# Patient Record
Sex: Female | Born: 1990 | Race: White | Hispanic: Yes | Smoking: Never smoker
Health system: Southern US, Community
[De-identification: ages and names within clinical notes are randomized; demographics above are authoritative.]

## PROBLEM LIST (undated history)

## (undated) ENCOUNTER — Ambulatory Visit

## (undated) DIAGNOSIS — G43909 Migraine, unspecified, not intractable, without status migrainosus: Secondary | ICD-10-CM

## (undated) DIAGNOSIS — N390 Urinary tract infection, site not specified: Secondary | ICD-10-CM

## (undated) DIAGNOSIS — J45909 Unspecified asthma, uncomplicated: Secondary | ICD-10-CM

## (undated) DIAGNOSIS — R7303 Prediabetes: Secondary | ICD-10-CM

## (undated) DIAGNOSIS — M1990 Unspecified osteoarthritis, unspecified site: Secondary | ICD-10-CM

## (undated) HISTORY — DX: Urinary tract infection, site not specified: N39.0

## (undated) HISTORY — DX: Migraine, unspecified, not intractable, without status migrainosus: G43.909

---
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
## 2013-10-30 ENCOUNTER — Emergency Department (INDEPENDENT_AMBULATORY_CARE_PROVIDER_SITE_OTHER): Payer: PRIVATE HEALTH INSURANCE

## 2013-10-30 ENCOUNTER — Emergency Department (INDEPENDENT_AMBULATORY_CARE_PROVIDER_SITE_OTHER)
Admission: EM | Admit: 2013-10-30 | Discharge: 2013-10-30 | Disposition: A | Discharge status: Home / Self Care | Payer: PRIVATE HEALTH INSURANCE | Source: Home / Self Care | Attending: Family Medicine | Admitting: Family Medicine

## 2013-10-30 ENCOUNTER — Encounter (HOSPITAL_COMMUNITY): Payer: Self-pay | Admitting: Emergency Medicine

## 2013-10-30 DIAGNOSIS — J45990 Exercise induced bronchospasm: Secondary | ICD-10-CM

## 2013-10-30 MED ORDER — IPRATROPIUM-ALBUTEROL 0.5-2.5 (3) MG/3ML IN SOLN
RESPIRATORY_TRACT | Status: AC
  Filled 2013-10-30: qty 3

## 2013-10-30 MED ORDER — ALBUTEROL SULFATE (2.5 MG/3ML) 0.083% IN NEBU
5.0000 mg | INHALATION_SOLUTION | Freq: Once | RESPIRATORY_TRACT | Status: AC
  Administered 2013-10-30: 5 mg via RESPIRATORY_TRACT

## 2013-10-30 MED ORDER — ALBUTEROL SULFATE HFA 108 (90 BASE) MCG/ACT IN AERS
1.0000 | INHALATION_SPRAY | Freq: Four times a day (QID) | RESPIRATORY_TRACT | Status: DC | PRN
Start: 2013-10-30 — End: 2016-03-29

## 2013-10-30 MED ORDER — IPRATROPIUM BROMIDE 0.02 % IN SOLN
0.5000 mg | Freq: Once | RESPIRATORY_TRACT | Status: AC
  Administered 2013-10-30: 0.5 mg via RESPIRATORY_TRACT

## 2013-10-30 NOTE — ED Notes (Signed)
C/o coughing which is non productive States throat is itchy which makes her cough States she has chest pain which makes her chest feels tight

## 2013-10-30 NOTE — ED Provider Notes (Signed)
CSN: [id number]     Arrival date & time 10/30/13  1732 History   First MD Initiated Contact with Patient 10/30/13 1822     Chief Complaint  Patient presents with  . Cough  . Chest Pain   (Consider location/radiation/quality/duration/timing/severity/associated sxs/prior Treatment) HPI Comments: Patient reports one week history of non productive cough that is most noticeable after exercise. States her throat feels itchy. The coughing that she experiences makes her chest feel "tight." States she did have episode of pneumonia in April 2015 while she was away at college and she would also like to make sure that this has resolved. Denies chest pain, fever or hemoptysis.  [employer] student home for the summer PCP: none  The history is provided by the patient.    History reviewed. No pertinent past medical history. History reviewed. No pertinent past surgical history. History reviewed. No pertinent family history. History  Substance Use Topics  . Smoking status: Never Smoker   . Smokeless tobacco: Not on file  . Alcohol Use: No   OB History   Grav Para Term Preterm Abortions TAB SAB Ect Mult Living                 Review of Systems  Constitutional: Negative.   HENT: Positive for congestion and postnasal drip. Negative for ear pain, mouth sores, nosebleeds, rhinorrhea, sinus pressure, sore throat, trouble swallowing and voice change.   Eyes: Negative.   Respiratory: Positive for cough, chest tightness and wheezing.   Cardiovascular: Negative.   Gastrointestinal: Negative.   Genitourinary: Negative.   Musculoskeletal: Negative.   Skin: Negative.   Neurological: Negative for dizziness, syncope, weakness, light-headedness and headaches.    Allergies  Review of patient's allergies indicates no known allergies.  Home Medications   Prior to Admission medications   Medication Sig Start Date End Date Taking? Authorizing Jebadiah Imperato  albuterol (PROVENTIL HFA;VENTOLIN HFA) 108  (90 BASE) MCG/ACT inhaler Inhale 1-2 puffs into the lungs every 6 (six) hours as needed for wheezing or shortness of breath (or persistent cough). 10/30/13   Jess BartersJennifer Lee Presson, PA   BP 124/81  Pulse 80  Temp(Src) 99.1 F (37.3 C) (Oral)  Resp 16  SpO2 99%  LMP 10/16/2013 Physical Exam  Nursing note and vitals reviewed. Constitutional: She is oriented to person, place, and time. She appears well-developed and well-nourished. No distress.  HENT:  Head: Normocephalic and atraumatic.  Right Ear: Hearing, tympanic membrane, external ear and ear canal normal.  Left Ear: Hearing, tympanic membrane, external ear and ear canal normal.  Nose: Nose normal.  Mouth/Throat: Uvula is midline, oropharynx is clear and moist and mucous membranes are normal.  Eyes: Conjunctivae are normal. Right eye exhibits no discharge. Left eye exhibits no discharge.  Neck: Normal range of motion. Neck supple.  Cardiovascular: Normal rate, regular rhythm and normal heart sounds.   Pulmonary/Chest: Effort normal and breath sounds normal. No respiratory distress. She has no wheezes. She exhibits no tenderness.  Musculoskeletal: Normal range of motion.  Neurological: She is alert and oriented to person, place, and time.  Skin: Skin is warm and dry. No rash noted. No erythema.  Psychiatric: She has a normal mood and affect. Her behavior is normal.    ED Course  Procedures (including critical care time) Labs Review Labs Reviewed - No data to display  Imaging Review Dg Chest 2 View  10/30/2013   CLINICAL DATA:  One week history of cough. Shortness of breath over the past 4-5 days.  EXAM: CHEST  2 VIEW  COMPARISON:  05/16/2010, 01/04/2010.  FINDINGS: Cardiomediastinal silhouette unremarkable and unchanged. Lungs clear. Bronchovascular markings normal. Pulmonary vascularity normal. No visible pleural effusions. No pneumothorax. Visualized bony thorax intact. No significant interval change.  IMPRESSION: Normal and stable  examination.   Electronically Signed   By: Hulan Saashomas  Lawrence M.D.   On: 10/30/2013 19:22     MDM   1. Bronchospasm, exercise-induced   [facility] and states she feels much better. CXR unremarkable. Will advise her to begin OTC antihistamine such as Zyrtec or Claritin for PND and itchy throat and use albuterol MDI as prescribed for exercised induced bronchospasm. Follow up PCP of her choice.     Jess BartersJennifer Lee GorePresson, [place] 10/30/13 2000

## 2013-10-30 NOTE — ED Provider Notes (Signed)
Medical screening examination/treatment/procedure(s) were performed by resident physician or non-physician practitioner and as supervising physician I was immediately available for consultation/collaboration.   Barkley BrunsKINDL,JAMES DOUGLAS MD.   Linna HoffJames D Kindl, MD 10/30/13 2124

## 2013-10-30 NOTE — Discharge Instructions (Signed)
Your chest xray was normal. You do not have pneumonia. Your EKG was normal as well. It sounds like you are having a bit of exercised induced asthma and cough. I would recommend you use the albuterol inhaler as prescribed for your symptoms and if you have no improvement, please follow up with your primary care doctor. If symptoms become suddenly worse or severe and do not respond to the medication, please seek evaluation at your nearest ER.  Bronchospasm A bronchospasm is a spasm or tightening of the airways going into the lungs. During a bronchospasm breathing becomes more difficult because the airways get smaller. When this happens there can be coughing, a whistling sound when breathing (wheezing), and difficulty breathing. Bronchospasm is often associated with asthma, but not all patients who experience a bronchospasm have asthma. CAUSES  A bronchospasm is caused by inflammation or irritation of the airways. The inflammation or irritation may be triggered by:   Allergies (such as to animals, pollen, food, or mold). Allergens that cause bronchospasm may cause wheezing immediately after exposure or many hours later.   Infection. Viral infections are believed to be the most common cause of bronchospasm.   Exercise.   Irritants (such as pollution, cigarette smoke, strong odors, aerosol sprays, and paint fumes).   Weather changes. Winds increase molds and pollens in the air. Rain refreshes the air by washing irritants out. Cold air may cause inflammation.   Stress and emotional upset.  SIGNS AND SYMPTOMS   Wheezing.   Excessive nighttime coughing.   Frequent or severe coughing with a simple cold.   Chest tightness.   Shortness of breath.  DIAGNOSIS  Bronchospasm is usually diagnosed through a history and physical exam. Tests, such as chest X-rays, are sometimes done to look for other conditions. TREATMENT   Inhaled medicines can be given to open up your airways and help you  breathe. The medicines can be given using either an inhaler or a nebulizer machine.  Corticosteroid medicines may be given for severe bronchospasm, usually when it is associated with asthma. HOME CARE INSTRUCTIONS   Always have a plan prepared for seeking medical care. Know when to call your health care provider and local emergency services (911 in the [place] Know where you can access local emergency care.  Only take medicines as directed by your health care provider.  If you were prescribed an inhaler or nebulizer machine, ask your health care provider to explain how to use it correctly. Always use a spacer with your inhaler if you were given one.  It is necessary to remain calm during an attack. Try to relax and breathe more slowly.  Control your home environment in the following ways:   Change your heating and air conditioning filter at least once a month.   Limit your use of fireplaces and wood stoves.  Do not smoke and do not allow smoking in your home.   Avoid exposure to perfumes and fragrances.   Get rid of pests (such as roaches and mice) and their droppings.   Throw away plants if you see mold on them.   Keep your house clean and dust free.   Replace carpet with wood, tile, or vinyl flooring. Carpet can trap dander and dust.   Use allergy-proof pillows, mattress covers, and box spring covers.   Wash bed sheets and blankets every week in hot water and dry them in a dryer.   Use blankets that are made of polyester or cotton.   Wash hands frequently.  SEEK MEDICAL CARE IF:   You have muscle aches.   You have chest pain.   The sputum changes from clear or white to yellow, green, gray, or bloody.   The sputum you cough up gets thicker.   There are problems that may be related to the medicine you are given, such as a rash, itching, swelling, or trouble breathing.  SEEK IMMEDIATE MEDICAL CARE IF:   You have worsening wheezing and coughing even  after taking your prescribed medicines.   You have increased difficulty breathing.   You develop severe chest pain. MAKE SURE YOU:   Understand these instructions.  Will watch your condition.  Will get help right away if you are not doing well or get worse. Document Released: 04/23/2003 Document Revised: 04/25/2013 Document Reviewed: 10/10/2012 [facility] Patient Information 2015 [place], [place]. This information is not intended to replace advice given to you by your health care provider. Make sure you discuss any questions you have with your health care provider.  Cough, Adult  A cough is a reflex that helps clear your throat and airways. It can help heal the body or may be a reaction to an irritated airway. A cough may only last 2 or 3 weeks (acute) or may last more than 8 weeks (chronic).  CAUSES Acute cough:  Viral or bacterial infections. Chronic cough:  Infections.  Allergies.  Asthma.  Post-nasal drip.  Smoking.  Heartburn or acid reflux.  Some medicines.  Chronic lung problems (COPD).  Cancer. SYMPTOMS   Cough.  Fever.  Chest pain.  Increased breathing rate.  High-pitched whistling sound when breathing (wheezing).  Colored mucus that you cough up (sputum). TREATMENT   A bacterial cough may be treated with antibiotic medicine.  A viral cough must run its course and will not respond to antibiotics.  Your caregiver may recommend other treatments if you have a chronic cough. HOME CARE INSTRUCTIONS   Only take over-the-counter or prescription medicines for pain, discomfort, or fever as directed by your caregiver. Use cough suppressants only as directed by your caregiver.  Use a cold steam vaporizer or humidifier in your bedroom or home to help loosen secretions.  Sleep in a semi-upright position if your cough is worse at night.  Rest as needed.  Stop smoking if you smoke. SEEK IMMEDIATE MEDICAL CARE IF:   You have pus in your sputum.  Your  cough starts to worsen.  You cannot control your cough with suppressants and are losing sleep.  You begin coughing up blood.  You have difficulty breathing.  You develop pain which is getting worse or is uncontrolled with medicine.  You have a fever. MAKE SURE YOU:   Understand these instructions.  Will watch your condition.  Will get help right away if you are not doing well or get worse. Document Released: 10/17/2010 Document Revised: 07/13/2011 Document Reviewed: 10/17/2010 [facility] Patient Information 2015 [place], [place]. This information is not intended to replace advice given to you by your health care provider. Make sure you discuss any questions you have with your health care provider.  Exercise-Induced Bronchospasm A bronchospam is a condition that is commonly caused by exercise, in which the muscles around the bronchioles (airways to the lungs) tighten, causing the airway to constrict. Exercise-induced brochospams are usually associated with short periods of vigorous activity. Many people who experience an exercise-induced bronchospam may not notice at the time of the event; however, the athlete may later experience symptoms that negatively affect training and performance. SYMPTOMS  High-pitched sounds with breathing (wheezing).  Coughing.  Increased work of breathing (dyspnea).  Rapid breathing (hyperventilation).  Chest pain.  Symptoms occurring 4 to 6 hours after exercise is completed (late-phase reaction). CAUSES  It is not know why certain individuals experience bronchospasms. Respiratory specialists currently think that the cool or dry air breathed in may cause damage the lining of the bronchioles, which elicits and inflammatory response. The inflammation causes the airways to narrow and symptoms then occur. RISK INCREASES WITH:  Viral infections.  Exercise in cold air.  Exercise in dry conditions.  Poor physical fitness.  High-intensity  exercise.  No warm-up before play.  Frequent exposure to substances that produce allergic reactions (allergens). PREVENTION   Improve conditioning.  Treat allergies.  Breathe warm air (cover mouth and nose with a towel or scarf).  Warm up for an appropriate period of time before physical activity.  Gradually decrease intensity (warm down ) for an appropriate period of time after physical activity. PROGNOSIS  Most people with exercise-induced asthma respond well to medication. Patients are typically prescribed an inhaler to treat bronchospams. However, if symptoms persist despite treatment and continue to affect performance, individuals may need to consider avoiding activities that produce symptoms. RELATED COMPLICATIONS   Decreased athletic performance.  Inability to condition as well as expected.  Side effects from medications. TREATMENT   Maintain physical fitness.  Run with a scarf or towel over your mouth in cold, dry air.  Complete at least 10 minutes of warm up before high-intensity exercise.  Warm down after play.  Treat allergies. MEDICATION  The usual initial medication is an albuterol inhaler, which expands the constricted bronchioles.  The second-line medication is inhaled corticosteroids, which reduce inflammation in the airway.  Alternative medications included sodium cromoglycate and nedocromil inhalers.  Long-acting medications such as salmeterol can also be used as second-line medications. ACTIVITY  If medications are able to treat the offending symptoms, then no activity modification is required. If you know you will be training or competing in cold or dry climates take extra precaution to prevent symptoms. DIET  No specific diet is recommended.  SEEK MEDICAL CARE IF:   Greater than normal fatigue with exercise.  Greater than normal difficulty breathing occurs with exercise.  Increased wheezing with exercise.  You appear to be breathing harder  and faster than expected with training.  Allergies appear to be uncontrollable.  You experience chest pain with exercise. Document Released: 04/20/2005 Document Revised: 07/13/2011 Document Reviewed: 08/02/2008 [facility] Patient Information 2015 [place]. This information is not intended to replace advice given to you by your health care provider. Make sure you discuss any questions you have with your health care provider.

## 2014-10-15 ENCOUNTER — Emergency Department (HOSPITAL_COMMUNITY)
Admission: EM | Admit: 2014-10-15 | Discharge: 2014-10-15 | Disposition: A | Discharge status: Home / Self Care | Payer: BLUE CROSS/BLUE SHIELD | Source: Home / Self Care | Attending: Family Medicine | Admitting: Family Medicine

## 2014-10-15 ENCOUNTER — Encounter (HOSPITAL_COMMUNITY): Payer: Self-pay | Admitting: Emergency Medicine

## 2014-10-15 DIAGNOSIS — N3000 Acute cystitis without hematuria: Secondary | ICD-10-CM | POA: Diagnosis not present

## 2014-10-15 LAB — POCT URINALYSIS DIP (DEVICE)
BILIRUBIN URINE: NEGATIVE
GLUCOSE, UA: NEGATIVE mg/dL
Hgb urine dipstick: NEGATIVE
Ketones, ur: NEGATIVE mg/dL
NITRITE: NEGATIVE
Protein, ur: NEGATIVE mg/dL
Specific Gravity, Urine: >=1.03 (ref 1.005–1.030)
Urobilinogen, UA: 0.2 mg/dL (ref 0.0–1.0)
pH: 5.5 (ref 5.0–8.0)

## 2014-10-15 LAB — POCT PREGNANCY, URINE: Preg Test, Ur: NEGATIVE

## 2014-10-15 MED ORDER — CEPHALEXIN 500 MG PO CAPS: 500.0000 mg | ORAL_CAPSULE | Freq: Two times a day (BID) | ORAL | Status: DC

## 2014-10-15 NOTE — ED Notes (Signed)
C/o poss UTI sx onset 1 week Sx include urinary freq/urgency Denies fevers, chills, hematuria  Alert, no signs of acute distress.

## 2014-10-15 NOTE — Discharge Instructions (Signed)
Thank you for coming in today. ° °Urinary Tract Infection °Urinary tract infections (UTIs) can develop anywhere along your urinary tract. Your urinary tract is your body's drainage system for removing wastes and extra water. Your urinary tract includes two kidneys, two ureters, a bladder, and a urethra. Your kidneys are a pair of bean-shaped organs. Each kidney is about the size of your fist. They are located below your ribs, one on each side of your spine. °CAUSES °Infections are caused by microbes, which are microscopic organisms, including fungi, viruses, and bacteria. These organisms are so small that they can only be seen through a microscope. Bacteria are the microbes that most commonly cause UTIs. °SYMPTOMS  °Symptoms of UTIs may vary by age and gender of the patient and by the location of the infection. Symptoms in young women typically include a frequent and intense urge to urinate and a painful, burning feeling in the bladder or urethra during urination. Older women and men are more likely to be tired, shaky, and weak and have muscle aches and abdominal pain. A fever may mean the infection is in your kidneys. Other symptoms of a kidney infection include pain in your back or sides below the ribs, nausea, and vomiting. °DIAGNOSIS °To diagnose a UTI, your caregiver will ask you about your symptoms. Your caregiver also will ask to provide a urine sample. The urine sample will be tested for bacteria and white blood cells. White blood cells are made by your body to help fight infection. °TREATMENT  °Typically, UTIs can be treated with medication. Because most UTIs are caused by a bacterial infection, they usually can be treated with the use of antibiotics. The choice of antibiotic and length of treatment depend on your symptoms and the type of bacteria causing your infection. °HOME CARE INSTRUCTIONS °· If you were prescribed antibiotics, take them exactly as your caregiver instructs you. Finish the medication  even if you feel better after you have only taken some of the medication. °· Drink enough water and fluids to keep your urine clear or pale yellow. °· Avoid caffeine, tea, and carbonated beverages. They tend to irritate your bladder. °· Empty your bladder often. Avoid holding urine for long periods of time. °· Empty your bladder before and after sexual intercourse. °· After a bowel movement, women should cleanse from front to back. Use each tissue only once. °SEEK MEDICAL CARE IF:  °· You have back pain. °· You develop a fever. °· Your symptoms do not begin to resolve within 3 days. °SEEK IMMEDIATE MEDICAL CARE IF:  °· You have severe back pain or lower abdominal pain. °· You develop chills. °· You have nausea or vomiting. °· You have continued burning or discomfort with urination. °MAKE SURE YOU:  °· Understand these instructions. °· Will watch your condition. °· Will get help right away if you are not doing well or get worse. °Document Released: 01/28/2005 Document Revised: 10/20/2011 Document Reviewed: 05/29/2011 °[employer]® Patient Information ©2015 [employer]. This information is not intended to replace advice given to you by your health care provider. Make sure you discuss any questions you have with your health care provider. ° °

## 2014-10-15 NOTE — ED Provider Notes (Signed)
Chisato Hatzis is a 24 y.o. female who presents to Urgent Care today for urinary frequency urgency dysuria and burning present for one week. No fevers or chills nausea or vomiting. No vaginal discharge. She has tried ibuprofen which helps some. She feels well otherwise.   History reviewed. No pertinent past medical history. History reviewed. No pertinent past surgical history. History  Substance Use Topics  . Smoking status: Never Smoker   . Smokeless tobacco: Not on file  . Alcohol Use: No   ROS as above Medications: No current facility-administered medications for this encounter.   Current Outpatient Prescriptions  Medication Sig Dispense Refill  . albuterol (PROVENTIL HFA;VENTOLIN HFA) 108 (90 BASE) MCG/ACT inhaler Inhale 1-2 puffs into the lungs every 6 (six) hours as needed for wheezing or shortness of breath (or persistent cough). 1 Inhaler 1  . cephALEXin (KEFLEX) 500 MG capsule Take 1 capsule (500 mg total) by mouth 2 (two) times daily. 14 capsule 0   No Known Allergies   Exam:  BP 134/90 mmHg  Pulse 98  Temp(Src) 98.4 F (36.9 C) (Oral)  Resp 16  SpO2 100%  LMP 10/01/2014 Gen: Well NAD HEENT: EOMI,  MMM Lungs: Normal work of breathing. CTABL Heart: RRR no MRG Abd: NABS, Soft. Nondistended, Nontender no CV angle tenderness to percussion Exts: Brisk capillary refill, warm and well perfused.   Results for orders placed or performed during the hospital encounter of 10/15/14 (from the past 24 hour(s))  POCT urinalysis dip (device)     Status: Abnormal   Collection Time: 10/15/14  6:59 PM  Result Value Ref Range   Glucose, UA NEGATIVE NEGATIVE mg/dL   Bilirubin Urine NEGATIVE NEGATIVE   Ketones, ur NEGATIVE NEGATIVE mg/dL   Specific Gravity, Urine >=1.030 1.005 - 1.030   Hgb urine dipstick NEGATIVE NEGATIVE   pH 5.5 5.0 - 8.0   Protein, ur NEGATIVE NEGATIVE mg/dL   Urobilinogen, UA 0.2 0.0 - 1.0 mg/dL   Nitrite NEGATIVE NEGATIVE   Leukocytes, UA TRACE (A)  NEGATIVE  Pregnancy, urine POC     Status: None   Collection Time: 10/15/14  7:03 PM  Result Value Ref Range   Preg Test, Ur NEGATIVE NEGATIVE   No results found.  Assessment and Plan: 24 y.o. female with urinary tract infection. Treat with Keflex. Return as needed.  Discussed warning signs or symptoms. Please see discharge instructions. Patient expresses understanding.     Rodolph Bong, MD 10/15/14 [phone]

## 2015-11-27 ENCOUNTER — Emergency Department (HOSPITAL_COMMUNITY)
Admission: EM | Admit: 2015-11-27 | Discharge: 2015-11-28 | Disposition: A | Discharge status: Home / Self Care | Payer: BLUE CROSS/BLUE SHIELD | Attending: Emergency Medicine | Admitting: Emergency Medicine

## 2015-11-27 ENCOUNTER — Encounter (HOSPITAL_COMMUNITY): Payer: Self-pay | Admitting: *Deleted

## 2015-11-27 DIAGNOSIS — Z79899 Other long term (current) drug therapy: Secondary | ICD-10-CM | POA: Insufficient documentation

## 2015-11-27 DIAGNOSIS — J45909 Unspecified asthma, uncomplicated: Secondary | ICD-10-CM | POA: Insufficient documentation

## 2015-11-27 DIAGNOSIS — N390 Urinary tract infection, site not specified: Secondary | ICD-10-CM | POA: Insufficient documentation

## 2015-11-27 HISTORY — DX: Unspecified asthma, uncomplicated: J45.909

## 2015-11-27 LAB — POC URINE PREG, ED: PREG TEST UR: NEGATIVE

## 2015-11-27 LAB — COMPREHENSIVE METABOLIC PANEL
ALBUMIN: 4.1 g/dL (ref 3.5–5.0)
ALK PHOS: 69 U/L (ref 38–126)
ALT: 39 U/L (ref 14–54)
AST: 33 U/L (ref 15–41)
Anion gap: 9 (ref 5–15)
BILIRUBIN TOTAL: 0.5 mg/dL (ref 0.3–1.2)
BUN: 9 mg/dL (ref 6–20)
CALCIUM: 9.5 mg/dL (ref 8.9–10.3)
CO2: 24 mmol/L (ref 22–32)
Chloride: 104 mmol/L (ref 101–111)
Creatinine, Ser: 0.68 mg/dL (ref 0.44–1.00)
GFR calc Af Amer: >60 mL/min (ref >60)
GLUCOSE: 82 mg/dL (ref 65–99)
Potassium: 3.7 mmol/L (ref 3.5–5.1)
Sodium: 137 mmol/L (ref 135–145)
TOTAL PROTEIN: 7 g/dL (ref 6.5–8.1)

## 2015-11-27 LAB — URINALYSIS, ROUTINE W REFLEX MICROSCOPIC
BILIRUBIN URINE: NEGATIVE
Glucose, UA: NEGATIVE mg/dL
KETONES UR: NEGATIVE mg/dL
NITRITE: NEGATIVE
PROTEIN: NEGATIVE mg/dL
Specific Gravity, Urine: 1.026 (ref 1.005–1.030)
pH: 5 (ref 5.0–8.0)

## 2015-11-27 LAB — URINE MICROSCOPIC-ADD ON

## 2015-11-27 LAB — CBC
HCT: 39.7 % (ref 36.0–46.0)
Hemoglobin: 13.5 g/dL (ref 12.0–15.0)
MCH: 29.2 pg (ref 26.0–34.0)
MCHC: 34 g/dL (ref 30.0–36.0)
MCV: 85.9 fL (ref 78.0–100.0)
PLATELETS: 337 10*3/uL (ref 150–400)
RBC: 4.62 MIL/uL (ref 3.87–5.11)
RDW: 12.8 % (ref 11.5–15.5)
WBC: 9.7 10*3/uL (ref 4.0–10.5)

## 2015-11-27 LAB — LIPASE, BLOOD: Lipase: 25 U/L (ref 11–51)

## 2015-11-27 MED ORDER — PHENAZOPYRIDINE HCL 200 MG PO TABS: 200.0000 mg | ORAL_TABLET | Freq: Three times a day (TID) | ORAL | 0 refills | Status: DC

## 2015-11-27 MED ORDER — CEPHALEXIN 500 MG PO CAPS: 500.0000 mg | ORAL_CAPSULE | Freq: Two times a day (BID) | ORAL | 0 refills | Status: DC

## 2015-11-27 NOTE — ED Notes (Signed)
Pt refused to get pelvic exam done. Stated she had never had one done and had never been sexually active and did not want it done. She stated if her pain got worse she would come back and get an exam.

## 2015-11-27 NOTE — ED Triage Notes (Signed)
Patient present stating has been burning in her lower abd area, increased urination (strong odor) but has been denying vaginal discharge

## 2015-11-27 NOTE — Discharge Instructions (Signed)
Take your antibiotic as prescribed until you have completed the prescription.  Please follow up with a primary care provider from the Resource Guide provided below in 1 week if your symptoms have not improved. Please return to the Emergency Department if symptoms worsen or new onset of fever, flank pain, abdominal pain, vomiting, difficulty urinating, urinary retention, blood in urine, vaginal bleeding, vaginal discharge.

## 2015-11-27 NOTE — ED Provider Notes (Signed)
[facility]-EMERGENCY DEPT Provider Note   CSN: [id number] Arrival date & time: 11/27/15  2135  First Provider Contact:  First MD Initiated Contact with Patient 11/27/15 2243        History   Chief Complaint Chief Complaint  Patient presents with  . Abdominal Pain    HPI Tara Joseph is a 25 y.o. female.  Pt is a 25 yo female with no PMH who presents to the ED with complaint of abdominal pain, onset 3 days. Pt reports having intermittent "burning" sensation to bilateral lower abdomen which she notes radiates to up the sides of her abdomen. Endorses associated urinary frequency. Pt reports having similar sxs in the past when she was dx with UTI. Denies fever, chills, SOB, CP, N/V/D, hematuria, dysuria, vaginal bleeding, vaginal d/c. LMP 11/09/15. Denies hx of abdominal surgery. Denies taking any medications PTA.    Abdominal Pain   Associated symptoms include frequency.    Past Medical History:  Diagnosis Date  . Asthma     There are no active problems to display for this patient.   History reviewed. No pertinent surgical history.  OB History    No data available       Home Medications    Prior to Admission medications   Medication Sig Start Date End Date Taking? Authorizing Provider  albuterol (PROVENTIL HFA;VENTOLIN HFA) 108 (90 BASE) MCG/ACT inhaler Inhale 1-2 puffs into the lungs every 6 (six) hours as needed for wheezing or shortness of breath (or persistent cough). 10/30/13   Mathis Fare Presson, PA  cephALEXin (KEFLEX) 500 MG capsule Take 1 capsule (500 mg total) by mouth 2 (two) times daily. 10/15/14   Rodolph Bong, MD  cephALEXin (KEFLEX) 500 MG capsule Take 1 capsule (500 mg total) by mouth 2 (two) times daily. 11/27/15   Barrett Henle, PA-C  phenazopyridine (PYRIDIUM) 200 MG tablet Take 1 tablet (200 mg total) by mouth 3 (three) times daily. 11/27/15   Barrett Henle, PA-C    Family History No family history on file.  Social  History Social History  Substance Use Topics  . Smoking status: Never Smoker  . Smokeless tobacco: Never Used  . Alcohol use No     Allergies   Review of patient's allergies indicates no known allergies.   Review of Systems Review of Systems  Gastrointestinal: Positive for abdominal pain.  Genitourinary: Positive for frequency.  All other systems reviewed and are negative.    Physical Exam Updated Vital Signs BP 131/81   Pulse 82   Temp 98.8 F (37.1 C) (Oral)   Resp 18   Ht 5' (1.524 m)   Wt 73.2 kg   LMP 11/09/2015   SpO2 100%   BMI 31.50 kg/m   Physical Exam  Constitutional: She is oriented to person, place, and time. She appears well-developed and well-nourished. No distress.  HENT:  Head: Normocephalic and atraumatic.  Eyes: Conjunctivae and EOM are normal. Right eye exhibits no discharge. Left eye exhibits no discharge. No scleral icterus.  Neck: Normal range of motion. Neck supple.  Cardiovascular: Normal rate, regular rhythm, normal heart sounds and intact distal pulses.   Pulmonary/Chest: Effort normal and breath sounds normal. No respiratory distress. She has no wheezes. She has no rales. She exhibits no tenderness.  Abdominal: Soft. Bowel sounds are normal. She exhibits no distension and no mass. There is tenderness (mild tenderness in lower abdominal quadrants). There is no rebound and no guarding. No hernia.  Musculoskeletal: Normal range of  motion. She exhibits no edema.  Neurological: She is alert and oriented to person, place, and time.  Skin: Skin is warm and dry. She is not diaphoretic.  Nursing note and vitals reviewed.    ED Treatments / Results  Labs (all labs ordered are listed, but only abnormal results are displayed) Labs Reviewed  URINALYSIS, ROUTINE W REFLEX MICROSCOPIC (NOT AT [facility]) - Abnormal; Notable for the following:       Result Value   APPearance CLOUDY (*)    Hgb urine dipstick SMALL (*)    Leukocytes, UA TRACE (*)    All  other components within normal limits  URINE MICROSCOPIC-ADD ON - Abnormal; Notable for the following:    Squamous Epithelial / LPF 6-30 (*)    Bacteria, UA MANY (*)    All other components within normal limits  LIPASE, BLOOD  COMPREHENSIVE METABOLIC PANEL  CBC  POC URINE PREG, ED  GC/CHLAMYDIA PROBE AMP ([facility]) NOT AT [facility]    EKG  EKG Interpretation None       Radiology No results found.  Procedures Procedures (including critical care time)  Medications Ordered in ED Medications - No data to display   Initial Impression / Assessment and Plan / ED Course  I have reviewed the triage vital signs and the nursing notes.  Pertinent labs & imaging results that were available during my care of the patient were reviewed by me and considered in my medical decision making (see chart for details).  Clinical Course    Patient presents with lower abdominal discomfort with associated urinary frequency. Denies fever or flank pain. VSS. Exam showed mild lower abdominal pain, no peritoneal signs, no CVA tenderness. Pregnancy negative. UA consistent with UTI. Remaining labs unremarkable. Plan to do pelvic exam for further evaluation of lower abdominal tenderness. Pt refusing pelvic pt, pt reports she has never been sexually active and does not want a pelvic due to feeling like her sxs are due to her UTI. I discussed with pt my concern for further evaluation of her abdominal pain and recommendation for performing the pelvic exam. Pt reports understanding but continued to refuse pelvic. Plan to d/c pt home with keflex for UTI and outpatient follow up. Discussed strict return precautions with pt.   Final Clinical Impressions(s) / ED Diagnoses   Final diagnoses:  UTI (lower urinary tract infection)    New Prescriptions New Prescriptions   CEPHALEXIN (KEFLEX) 500 MG CAPSULE    Take 1 capsule (500 mg total) by mouth 2 (two) times daily.   PHENAZOPYRIDINE (PYRIDIUM) 200 MG TABLET    Take  1 tablet (200 mg total) by mouth 3 (three) times daily.     Satira Sark [place], [place] 11/27/15 2351    Zadie Rhine, MD 11/28/15 [phone]

## 2016-02-11 ENCOUNTER — Encounter (HOSPITAL_COMMUNITY): Payer: Self-pay | Admitting: Emergency Medicine

## 2016-02-11 ENCOUNTER — Ambulatory Visit (HOSPITAL_COMMUNITY)
Admission: EM | Admit: 2016-02-11 | Discharge: 2016-02-11 | Disposition: A | Discharge status: Home / Self Care | Payer: Self-pay | Attending: Emergency Medicine | Admitting: Emergency Medicine

## 2016-02-11 DIAGNOSIS — H65192 Other acute nonsuppurative otitis media, left ear: Secondary | ICD-10-CM

## 2016-02-11 DIAGNOSIS — H9202 Otalgia, left ear: Secondary | ICD-10-CM

## 2016-02-11 MED ORDER — FLUTICASONE PROPIONATE 50 MCG/ACT NA SUSP: 2.0000 | Freq: Every day | NASAL | 2 refills | Status: DC

## 2016-02-11 MED ORDER — PREDNISONE 20 MG PO TABS: ORAL_TABLET | ORAL | 0 refills | Status: DC

## 2016-02-11 NOTE — Discharge Instructions (Signed)
°  You may try taking 400-600 mg Ibuprofen (Motrin) every 6-8 hours for fever and pain  Alternate with Tylenol  500mg  (1000mg  first dose) acetaminophen (Tylenol) every 4-6 hours as needed for fever and pain

## 2016-02-11 NOTE — ED Triage Notes (Signed)
[facility] a [facility] x 3 weeks.

## 2016-02-11 NOTE — ED Provider Notes (Signed)
CSN: [id number]     Arrival date & time 02/11/16  1741 History   First MD Initiated Contact with Patient 02/11/16 1911     Chief Complaint  Patient presents with  . Otalgia   (Consider location/radiation/quality/duration/timing/severity/associated sxs/prior Treatment) HPI Tara Joseph is a 25 y.o. female presenting to UC with c/o bilateral ear pain, worse in Left ear for about 3 weeks. No associated nasal congestion, cough or sore throat. Pain is aching and sore, 6/10. She has not tried anything at home for pain. Denies fever, chills, n/v/d. Denies trauma to ear.    Past Medical History:  Diagnosis Date  . Asthma    History reviewed. No pertinent surgical history. History reviewed. No pertinent family history. Social History  Substance Use Topics  . Smoking status: Never Smoker  . Smokeless tobacco: Never Used  . Alcohol use No   OB History    No data available     Review of Systems  Constitutional: Negative for chills and fever.  HENT: Positive for ear pain ( bilateral, Left worse than Right). Negative for congestion, sore throat, trouble swallowing and voice change.   Respiratory: Negative for cough and shortness of breath.   Cardiovascular: Negative for chest pain and palpitations.  Gastrointestinal: Negative for abdominal pain, diarrhea, nausea and vomiting.  Musculoskeletal: Negative for arthralgias, back pain and myalgias.  Skin: Negative for rash.  Neurological: Negative for dizziness, light-headedness and headaches.    Allergies  Review of patient's allergies indicates no known allergies.  Home Medications   Prior to Admission medications   Medication Sig Start Date End Date Taking? Authorizing Provider  albuterol (PROVENTIL HFA;VENTOLIN HFA) 108 (90 BASE) MCG/ACT inhaler Inhale 1-2 puffs into the lungs every 6 (six) hours as needed for wheezing or shortness of breath (or persistent cough). 10/30/13   Mathis FareJennifer Lee H Presson, PA  cephALEXin (KEFLEX) 500 MG  capsule Take 1 capsule (500 mg total) by mouth 2 (two) times daily. 10/15/14   Rodolph BongEvan S Corey, MD  cephALEXin (KEFLEX) 500 MG capsule Take 1 capsule (500 mg total) by mouth 2 (two) times daily. 11/27/15   Barrett HenleNicole Elizabeth Nadeau, PA-C  fluticasone [facility](FLONASE) 50 MCG/ACT nasal spray Place 2 sprays into both nostrils daily. 02/11/16   Junius FinnerErin O'Malley, PA-C  phenazopyridine (PYRIDIUM) 200 MG tablet Take 1 tablet (200 mg total) by mouth 3 (three) times daily. 11/27/15   Barrett HenleNicole Elizabeth Nadeau, PA-C  predniSONE (DELTASONE) 20 MG tablet 3 tabs po day one, then 2 po daily x 4 days 02/11/16   Junius FinnerErin O'Malley, PA-C   Meds Ordered and Administered this Visit  Medications - No data to display  BP 129/68 (BP Location: Left Arm)   Pulse 86   Temp 98.4 F (36.9 C) (Oral)   Resp 18   LMP 01/12/2016 (Approximate)   SpO2 100%  No data found.   Physical Exam  Constitutional: She is oriented to person, place, and time. She appears well-developed and well-nourished. No distress.  HENT:  Head: Normocephalic and atraumatic.  Right Ear: Tympanic membrane normal.  Left Ear: Tympanic membrane is not erythematous and not bulging. A middle ear effusion is present.  Nose: Nose normal.  Mouth/Throat: Uvula is midline, oropharynx is clear and moist and mucous membranes are normal.  Eyes: EOM are normal.  Neck: Normal range of motion. Neck supple.  Cardiovascular: Normal rate, regular rhythm and normal heart sounds.   Pulmonary/Chest: Effort normal and breath sounds normal. No stridor. No respiratory distress. She has no wheezes. She  has no rales.  Musculoskeletal: Normal range of motion.  Lymphadenopathy:    She has no cervical adenopathy.  Neurological: She is alert and oriented to person, place, and time.  Skin: Skin is warm and dry. She is not diaphoretic.  Psychiatric: She has a normal mood and affect. Her behavior is normal.  Nursing note and vitals reviewed.   Urgent Care Course   Clinical Course     Procedures (including critical care time)  Labs Review Labs Reviewed - No data to display  Imaging Review No results found.    MDM   1. Acute otalgia, left   2. Acute effusion of left ear    Pt c/o bilateral ear pain for 3 weeks.  Middle ear effusion noted in Left ear w/o erythema or bulging. Pt is afebrile. No congestion noted on exam.  Rx: Prednisone and flonase. Encouraged sinus rinses. May have acetaminophen and ibuprofen as needed for pain. F/u in 1 week if not improving. Sooner if worsening.    Junius Finner, PA-C 02/11/16 [phone]

## 2016-03-29 ENCOUNTER — Ambulatory Visit (HOSPITAL_COMMUNITY)
Admission: EM | Admit: 2016-03-29 | Discharge: 2016-03-29 | Disposition: A | Discharge status: Home / Self Care | Payer: Self-pay | Attending: Internal Medicine | Admitting: Internal Medicine

## 2016-03-29 ENCOUNTER — Encounter (HOSPITAL_COMMUNITY): Payer: Self-pay | Admitting: Emergency Medicine

## 2016-03-29 DIAGNOSIS — L748 Other eccrine sweat disorders: Secondary | ICD-10-CM

## 2016-03-29 MED ORDER — SULFAMETHOXAZOLE-TRIMETHOPRIM 800-160 MG PO TABS: 1.0000 | ORAL_TABLET | Freq: Two times a day (BID) | ORAL | 0 refills | Status: AC

## 2016-03-29 NOTE — ED Provider Notes (Signed)
[facility]    CSN: [id number] Arrival date & time: 03/29/16  1252     History   Chief Complaint Chief Complaint  Patient presents with  . Abscess    HPI Tara Joseph is a 25 y.o. female.  She presents today with a 1wk hx of slightly painful/swollen area at posterior right labia.  This started as a "pimple," which she squeezed.  Subsequently, size/discomfort increased.  No unusual vaginal discharge, equivocal spotting one day this week. No abdominal or pelvic discomfort. No change in bowel habits. Initially had some dysuria, right after popping the bump, but none since. No urinary frequency. No nausea/vomiting. No malaise. No fever. One prior episode of similar symptoms, but bump was smaller and resolved spontaneously.  HPI  Past Medical History:  Diagnosis Date  . Asthma     History reviewed. No pertinent surgical history.     Home Medications    Prior to Admission medications   Medication Sig Start Date End Date Taking? Authorizing Provider  fluticasone (FLONASE) 50 MCG/ACT nasal spray Place 2 sprays into both nostrils daily. 02/11/16   Junius FinnerErin O'Malley, PA-C  sulfamethoxazole-trimethoprim (BACTRIM DS,SEPTRA DS) 800-160 MG tablet Take 1 tablet by mouth 2 (two) times daily. 03/29/16 04/05/16  Eustace MooreLaura W Britainy Kozub, MD    Family History No family history on file.  Social History Social History  Substance Use Topics  . Smoking status: Never Smoker  . Smokeless tobacco: Never Used  . Alcohol use No     Allergies   Patient has no known allergies.   Review of Systems Review of Systems  All other systems reviewed and are negative.    Physical Exam Triage Vital Signs ED Triage Vitals  Enc Vitals Group     BP 03/29/16 1410 116/82     Pulse Rate 03/29/16 1410 79     Resp 03/29/16 1410 16     Temp 03/29/16 1410 98.4 F (36.9 C)     Temp Source 03/29/16 1410 Oral     SpO2 03/29/16 1410 100 %     Weight 03/29/16 1422 150 lb (68 kg)     Height 03/29/16  1422 5' (1.524 m)     Pain Score 03/29/16 1423 4   Updated Vital Signs BP 116/82 (BP Location: Left Arm)   Pulse 79   Temp 98.4 F (36.9 C) (Oral)   Resp 16   Ht 5' (1.524 m)   Wt 150 lb (68 kg)   LMP 03/08/2016   SpO2 100%   BMI 29.29 kg/m  Physical Exam  Constitutional: She is oriented to person, place, and time. No distress.  Alert, nicely groomed  HENT:  Head: Atraumatic.  Eyes:  Conjugate gaze, no eye redness/drainage  Neck: Neck supple.  Cardiovascular: Normal rate.   Pulmonary/Chest: No respiratory distress.  Abdominal: She exhibits no distension.  Genitourinary:  Genitourinary Comments: Posterior right labia minora with 1 cm slightly cystic feeling lesion, minimally tender, not fluctuant, no erythema, no blisters or erosion.  Musculoskeletal: Normal range of motion.  No leg swelling  Neurological: She is alert and oriented to person, place, and time.  Skin: Skin is warm and dry.  No cyanosis  Nursing note and vitals reviewed.    UC Treatments / Results   Procedures Procedures (including critical care time) None today  Final Clinical Impressions(s) / UC Diagnoses   Final diagnoses:  Apocrine gland cyst   Symptoms today seem most consistent with a small, slightly inflamed (irritated) or blocked sweat gland.  A Bartholin's cyst is a similar type of thing and I have attached some information about these.  A prescription for trimethoprim/sulfa (antibiotic) was sent to the [facility] on [address], to ease inflammation and treat any infection.  Ice for 5-10 minutes 2-3 times daily may decrease swelling/pain.  Recheck if increasing pain/swelling/redness/drainage occur, or new fever >100.5.   New Prescriptions Discharge Medication List as of 03/29/2016  3:32 PM    START taking these medications   Details  sulfamethoxazole-trimethoprim (BACTRIM DS,SEPTRA DS) 800-160 MG tablet Take 1 tablet by mouth 2 (two) times daily., Starting Sun 03/29/2016, Until Sun 04/05/2016,  Normal         Eustace MooreLaura W Dhani Dannemiller, MD 04/05/16 2219

## 2016-03-29 NOTE — Discharge Instructions (Addendum)
Symptoms today seem most consistent with a small, slightly inflamed (irritated) or blocked sweat gland.  A Bartholin's cyst is a similar type of thing and I have attached some information about these.  A prescription for trimethoprim/sulfa (antibiotic) was sent to the [facility] on [address], to ease inflammation and treat any infection.  Ice for 5-10 minutes 2-3 times daily may decrease swelling/pain.  Recheck if increasing pain/swelling/redness/drainage occur, or new fever >100.5.

## 2016-03-29 NOTE — ED Triage Notes (Signed)
PT reports she believes she has an ingrown hair on her right groin. Has been present for 1 week. PT denies drainage or fever

## 2016-05-28 ENCOUNTER — Emergency Department (HOSPITAL_COMMUNITY): Payer: Self-pay

## 2016-05-28 ENCOUNTER — Encounter (HOSPITAL_COMMUNITY): Payer: Self-pay | Admitting: Emergency Medicine

## 2016-05-28 DIAGNOSIS — J111 Influenza due to unidentified influenza virus with other respiratory manifestations: Secondary | ICD-10-CM | POA: Insufficient documentation

## 2016-05-28 DIAGNOSIS — J45909 Unspecified asthma, uncomplicated: Secondary | ICD-10-CM | POA: Insufficient documentation

## 2016-05-28 LAB — CBC WITH DIFFERENTIAL/PLATELET
BASOS ABS: 0.1 10*3/uL (ref 0.0–0.1)
BASOS PCT: 1 %
EOS ABS: 0.1 10*3/uL (ref 0.0–0.7)
Eosinophils Relative: 2 %
HCT: 37.9 % (ref 36.0–46.0)
Hemoglobin: 12.6 g/dL (ref 12.0–15.0)
LYMPHS ABS: 1.6 10*3/uL (ref 0.7–4.0)
Lymphocytes Relative: 26 %
MCH: 28.3 pg (ref 26.0–34.0)
MCHC: 33.2 g/dL (ref 30.0–36.0)
MCV: 85.2 fL (ref 78.0–100.0)
Monocytes Absolute: 1 10*3/uL (ref 0.1–1.0)
Monocytes Relative: 17 %
Neutro Abs: 3.3 10*3/uL (ref 1.7–7.7)
Neutrophils Relative %: 54 %
PLATELETS: 280 10*3/uL (ref 150–400)
RBC: 4.45 MIL/uL (ref 3.87–5.11)
RDW: 13.1 % (ref 11.5–15.5)
WBC: 6.1 10*3/uL (ref 4.0–10.5)

## 2016-05-28 LAB — URINALYSIS, ROUTINE W REFLEX MICROSCOPIC
BILIRUBIN URINE: NEGATIVE
Glucose, UA: NEGATIVE mg/dL
HGB URINE DIPSTICK: NEGATIVE
Ketones, ur: 5 mg/dL — AB
NITRITE: NEGATIVE
PROTEIN: 30 mg/dL — AB
Specific Gravity, Urine: >1.046 — ABNORMAL HIGH (ref 1.005–1.030)
pH: 5 (ref 5.0–8.0)

## 2016-05-28 LAB — COMPREHENSIVE METABOLIC PANEL
ALBUMIN: 3.8 g/dL (ref 3.5–5.0)
ALK PHOS: 65 U/L (ref 38–126)
ALT: 30 U/L (ref 14–54)
AST: 27 U/L (ref 15–41)
Anion gap: 8 (ref 5–15)
BUN: 9 mg/dL (ref 6–20)
CALCIUM: 9 mg/dL (ref 8.9–10.3)
CHLORIDE: 107 mmol/L (ref 101–111)
CO2: 23 mmol/L (ref 22–32)
CREATININE: 0.71 mg/dL (ref 0.44–1.00)
GFR calc non Af Amer: >60 mL/min (ref >60)
GLUCOSE: 121 mg/dL — AB (ref 65–99)
Potassium: 3.1 mmol/L — ABNORMAL LOW (ref 3.5–5.1)
SODIUM: 138 mmol/L (ref 135–145)
Total Bilirubin: 0.4 mg/dL (ref 0.3–1.2)
Total Protein: 6.6 g/dL (ref 6.5–8.1)

## 2016-05-28 LAB — PREGNANCY, URINE: PREG TEST UR: NEGATIVE

## 2016-05-28 NOTE — ED Triage Notes (Signed)
Pt c/o productive cough, elevated temp, body aches and diarrhea x's 3 days.  Nausea, no vomiting

## 2016-05-29 ENCOUNTER — Emergency Department (HOSPITAL_COMMUNITY)
Admission: EM | Admit: 2016-05-29 | Discharge: 2016-05-29 | Disposition: A | Discharge status: Home / Self Care | Payer: Self-pay | Attending: Emergency Medicine | Admitting: Emergency Medicine

## 2016-05-29 DIAGNOSIS — R69 Illness, unspecified: Secondary | ICD-10-CM

## 2016-05-29 DIAGNOSIS — J111 Influenza due to unidentified influenza virus with other respiratory manifestations: Secondary | ICD-10-CM

## 2016-05-29 MED ORDER — POTASSIUM CHLORIDE CRYS ER 20 MEQ PO TBCR
40.0000 meq | EXTENDED_RELEASE_TABLET | Freq: Once | ORAL | Status: AC
  Administered 2016-05-29: 40 meq via ORAL
  Filled 2016-05-29: qty 2

## 2016-05-29 NOTE — ED Provider Notes (Signed)
[facility]   CSN: [id number] Arrival date & time: 05/28/16  2107   By signing my name below, I, Clovis Pu, attest that this documentation has been prepared under the direction and in the presence of Pricilla Loveless, MD  Electronically Signed: Clovis Pu, ED Scribe. 05/29/16. 3:49 AM.   History   Chief Complaint Chief Complaint  Patient presents with  . Fever   The history is provided by the patient. No language interpreter was used.   HPI Comments:  Tara Joseph is a 26 y.o. female, with a hx of pneumonia, who presents to the Emergency Department complaining of fever x 2 days. She also reports body aches, fatigue, 1 episode of diarrhea (loose stool), productive cough with yellow sputum, sore throat, nausea and headaches. She has been taking Nyquil with little relief. Pt denies ear pain, SOB, hematochezia, vomiting, abdominal pain, dysuria, frequency, urgency and any other associated symptoms at this time. Pt reports she used to have asthma when she was younger.   Past Medical History:  Diagnosis Date  . Asthma     There are no active problems to display for this patient.   History reviewed. No pertinent surgical history.  OB History    No data available       Home Medications    Prior to Admission medications   Medication Sig Start Date End Date Taking? Authorizing Provider  fluticasone (FLONASE) 50 MCG/ACT nasal spray Place 2 sprays into both nostrils daily. 02/11/16   Junius Finner, PA-C    Family History No family history on file.  Social History Social History  Substance Use Topics  . Smoking status: Never Smoker  . Smokeless tobacco: Never Used  . Alcohol use No     Allergies   Patient has no known allergies.   Review of Systems Review of Systems  Constitutional: Positive for fatigue and fever.  HENT: Positive for sore throat. Negative for ear pain.   Respiratory: Positive for cough. Negative for shortness of breath.     Gastrointestinal: Positive for diarrhea and nausea. Negative for abdominal pain and vomiting.  Genitourinary: Negative for dysuria, frequency and urgency.  Musculoskeletal: Positive for myalgias.  Neurological: Positive for headaches.  All other systems reviewed and are negative.  Physical Exam Updated Vital Signs BP 130/87   Pulse 98   Temp 98.8 F (37.1 C) (Oral)   Resp 20   Ht 5' (1.524 m)   Wt 170 lb 1 oz (77.1 kg)   LMP 05/12/2016 (Approximate)   SpO2 97%   BMI 33.21 kg/m   Physical Exam  Constitutional: She is oriented to person, place, and time. She appears well-developed and well-nourished.  HENT:  Head: Normocephalic and atraumatic.  Right Ear: External ear normal.  Left Ear: External ear normal.  Nose: Nose normal.  Mouth/Throat: Oropharynx is clear and moist.  Eyes: Right eye exhibits no discharge. Left eye exhibits no discharge.  Cardiovascular: Regular rhythm and normal heart sounds.  Tachycardia present.   Heart rate in low 100s  Pulmonary/Chest: Effort normal and breath sounds normal.  Abdominal: Soft. There is no tenderness.  Neurological: She is alert and oriented to person, place, and time.  Skin: Skin is warm and dry.  Nursing note and vitals reviewed.    ED Treatments / Results  DIAGNOSTIC STUDIES:  Oxygen Saturation is 100% on RA, normal by my interpretation.    COORDINATION OF CARE:  3:47 AM Discussed treatment plan with pt at bedside and pt agreed to  plan.  Labs (all labs ordered are listed, but only abnormal results are displayed) Labs Reviewed  COMPREHENSIVE METABOLIC PANEL - Abnormal; Notable for the following:       Result Value   Potassium 3.1 (*)    Glucose, Bld 121 (*)    All other components within normal limits  URINALYSIS, ROUTINE W REFLEX MICROSCOPIC - Abnormal; Notable for the following:    Color, Urine AMBER (*)    APPearance CLOUDY (*)    Specific Gravity, Urine >1.046 (*)    Ketones, ur 5 (*)    Protein, ur 30 (*)     Leukocytes, UA MODERATE (*)    Bacteria, UA MANY (*)    Squamous Epithelial / LPF 6-30 (*)    All other components within normal limits  CBC WITH DIFFERENTIAL/PLATELET  PREGNANCY, URINE    EKG  EKG Interpretation None       Radiology Dg Chest 2 View  Result Date: 05/28/2016 CLINICAL DATA:  Productive cough with fever and body aches. EXAM: CHEST  2 VIEW COMPARISON:  10/30/2013 FINDINGS: The heart size and mediastinal contours are within normal limits. Both lungs are clear. The visualized skeletal structures are unremarkable. IMPRESSION: No active cardiopulmonary disease. Electronically Signed   By: Kennith CenterEric  Mansell M.D.   On: 05/28/2016 21:58    Procedures Procedures (including critical care time)  Medications Ordered in ED Medications  potassium chloride SA (K-DUR,KLOR-CON) CR tablet 40 mEq (40 mEq Oral Given 05/29/16 0415)     Initial Impression / Assessment and Plan / ED Course  I have reviewed the triage vital signs and the nursing notes.  Pertinent labs & imaging results that were available during my care of the patient were reviewed by me and considered in my medical decision making (see chart for details).     Patient appears to have a flu-like illness. Her chart lists asthma as a prior history but she has not had issues with asthma for several years. Low risk for flu complications. No signs of PNA. At this time, appears well, appears adequately hydrated. Stable for d/c home with supportive care. Strict return precautions.  Final Clinical Impressions(s) / ED Diagnoses   Final diagnoses:  Influenza-like illness    New Prescriptions Discharge Medication List as of 05/29/2016  3:49 AM     I personally performed the services described in this documentation, which was scribed in my presence. The recorded information has been reviewed and is accurate.    Pricilla LovelessScott Correll Denbow, MD 05/29/16 0730

## 2017-01-18 ENCOUNTER — Encounter (HOSPITAL_COMMUNITY): Payer: Self-pay | Admitting: *Deleted

## 2017-01-18 ENCOUNTER — Ambulatory Visit (HOSPITAL_COMMUNITY)
Admission: EM | Admit: 2017-01-18 | Discharge: 2017-01-18 | Disposition: A | Discharge status: Home / Self Care | Payer: Self-pay | Attending: Urgent Care | Admitting: Urgent Care

## 2017-01-18 DIAGNOSIS — R509 Fever, unspecified: Secondary | ICD-10-CM | POA: Insufficient documentation

## 2017-01-18 DIAGNOSIS — R1310 Dysphagia, unspecified: Secondary | ICD-10-CM | POA: Insufficient documentation

## 2017-01-18 DIAGNOSIS — H9203 Otalgia, bilateral: Secondary | ICD-10-CM | POA: Insufficient documentation

## 2017-01-18 DIAGNOSIS — J45909 Unspecified asthma, uncomplicated: Secondary | ICD-10-CM | POA: Insufficient documentation

## 2017-01-18 DIAGNOSIS — R05 Cough: Secondary | ICD-10-CM

## 2017-01-18 DIAGNOSIS — R52 Pain, unspecified: Secondary | ICD-10-CM

## 2017-01-18 DIAGNOSIS — M791 Myalgia: Secondary | ICD-10-CM

## 2017-01-18 DIAGNOSIS — R059 Cough, unspecified: Secondary | ICD-10-CM

## 2017-01-18 DIAGNOSIS — J029 Acute pharyngitis, unspecified: Secondary | ICD-10-CM | POA: Insufficient documentation

## 2017-01-18 LAB — POCT RAPID STREP A: Streptococcus, Group A Screen (Direct): NEGATIVE

## 2017-01-18 MED ORDER — BENZONATATE 100 MG PO CAPS: 100.0000 mg | ORAL_CAPSULE | Freq: Three times a day (TID) | ORAL | 0 refills | Status: DC | PRN

## 2017-01-18 MED ORDER — AMOXICILLIN 875 MG PO TABS: 875.0000 mg | ORAL_TABLET | Freq: Two times a day (BID) | ORAL | 0 refills | Status: DC

## 2017-01-18 MED ORDER — HYDROCODONE-HOMATROPINE 5-1.5 MG/5ML PO SYRP: 5.0000 mL | ORAL_SOLUTION | Freq: Every evening | ORAL | 0 refills | Status: DC | PRN

## 2017-01-18 NOTE — ED Triage Notes (Signed)
Patient reports fever x several days, sore throat x 1 week, and body aches that started today. Patient has taken ibuprofen, tylenol, sore throat spray, and nyquil to help with symptoms.

## 2017-01-18 NOTE — ED Provider Notes (Signed)
  MRN: [id number] DOB: 01-25-1991  Subjective:   Tara Joseph is a 26 y.o. female presenting for chief complaint of Fever; Generalized Body Aches; Nasal Congestion; and Sore Throat  Reports 2 week history of subjective fever, sore throat, difficulty swallowing, bilateral ear pain (L>R), nasal congestion, productive cough with associated mild shob. Started having body aches today. Has tried APAP, ibuprofen, NyQuil, otc cold and sinus medication. Denies sinus pain, ear drainage, chest pain, wheezing, n/v, abdominal pain, rashes. Denies history of allergies. Denies smoking cigarettes.  Tara Joseph is not currently taking any medications also has No Known Allergies.  Tara Joseph  has a past medical history of Asthma. Denies past surgical history.   Objective:   Vitals: BP 128/83 (BP Location: Left Arm)   Pulse (!) 122   Temp 99.6 F (37.6 C) (Oral)   Resp 18   SpO2 99%   Physical Exam  Constitutional: She is oriented to person, place, and time. She appears well-developed and well-nourished.  HENT:  TM's intact bilaterally, no effusions or erythema. Nasal turbinates pink and moist, nasal passages patent. No sinus tenderness. Oropharynx clear, mucous membranes moist.  Eyes: Right eye exhibits no discharge. Left eye exhibits no discharge.  Neck: Normal range of motion. Neck supple.  Cardiovascular: Normal rate, regular rhythm and intact distal pulses.  Exam reveals no gallop and no friction rub.   No murmur heard. Pulmonary/Chest: No respiratory distress. She has no wheezes. She has no rales.  Lymphadenopathy:    She has cervical adenopathy (bilateral, anterior).  Neurological: She is alert and oriented to person, place, and time.  Skin: Skin is warm and dry.  Psychiatric: She has a normal mood and affect.   Results for orders placed or performed during the hospital encounter of 01/18/17 (from the past 24 hour(s))  POCT rapid strep A [facility])     Status: None   Collection Time: 01/18/17   8:02 PM  Result Value Ref Range   Streptococcus, Group A Screen (Direct) NEGATIVE NEGATIVE   Assessment and Plan :   Pharyngitis, unspecified etiology  Body aches  Cough  Will cover for infectious process given 2 week history of worsening symptoms despite supportive care. Patient is in agreement. Return-to-clinic precautions discussed, patient verbalized understanding.    Wallis Bamberg, PA-C [facility]  01/18/2017  8:07 PM    Wallis Bamberg, PA-C 01/18/17 2030

## 2017-01-21 LAB — CULTURE, GROUP A STREP (THRC)

## 2017-02-02 ENCOUNTER — Ambulatory Visit (HOSPITAL_COMMUNITY)
Admission: EM | Admit: 2017-02-02 | Discharge: 2017-02-02 | Disposition: A | Discharge status: Home / Self Care | Payer: Self-pay | Attending: Family Medicine | Admitting: Family Medicine

## 2017-02-02 ENCOUNTER — Encounter (HOSPITAL_COMMUNITY): Payer: Self-pay | Admitting: *Deleted

## 2017-02-02 DIAGNOSIS — J029 Acute pharyngitis, unspecified: Secondary | ICD-10-CM

## 2017-02-02 LAB — POCT INFECTIOUS MONO SCREEN: MONO SCREEN: NEGATIVE

## 2017-02-02 MED ORDER — PREDNISONE 10 MG (21) PO TBPK: ORAL_TABLET | Freq: Every day | ORAL | 0 refills | Status: DC

## 2017-02-02 MED ORDER — HYDROCODONE-ACETAMINOPHEN 5-325 MG PO TABS: 1.0000 | ORAL_TABLET | Freq: Four times a day (QID) | ORAL | 0 refills | Status: DC | PRN

## 2017-02-02 NOTE — ED Triage Notes (Signed)
Patient was here approx 2 weeks ago for flu like symptoms was prescribed antibiotics, states she is still having sore throat and feels like her lymph nodes are swollen. Reports pain with swallowing.

## 2017-02-03 NOTE — ED Provider Notes (Signed)
  [facility]   [id number] 02/02/17 Arrival Time: 1622  ASSESSMENT & PLAN:  1. Sore throat    Monospot: Negative.  Meds ordered this encounter  Medications  . predniSONE (STERAPRED UNI-PAK 21 TAB) 10 MG (21) TBPK tablet    Sig: Take by mouth daily. Take as directed.    Dispense:  21 tablet    Refill:  0  . HYDROcodone-acetaminophen (NORCO/VICODIN) 5-325 MG tablet    Sig: Take 1 tablet by mouth every 6 (six) hours as needed for moderate pain or severe pain.    Dispense:  6 tablet    Refill:  0   Trial of medications above. Medication sedation precautions given. Will f/u 48-72 hours if not improving, sooner if needed. No signs of peritonsillar abscess noted.  Reviewed expectations re: course of current medical issues. Questions answered. Outlined signs and symptoms indicating need for more acute intervention. Patient verbalized understanding. After Visit Summary given.   SUBJECTIVE:  Tara Joseph is a 26 y.o. female who reports a sore throat. Onset gradual beginning several weeks ago. Seen approx 2 weeks ago and treated with antibiotics. Finished course. No significant help. Feels nodes of neck swollen. Tolerating PO intake but painful swallowing. Ibuprofen with mild, temporary help. No n/v. No current fevers. No rashes. Overall fatigue. No significant respiratory symptoms..  ROS: As per HPI.   OBJECTIVE:  Vitals:   02/02/17 1647  BP: 132/83  Pulse: 86  Resp: 17  Temp: 98.4 F (36.9 C)  TempSrc: Oral  SpO2: 100%     General appearance: alert; no distress HEENT: mild erythema Neck: supple with FROM; cervical LAD; small and tender Lungs: clear to auscultation bilaterally Skin: reveals no rash; warm and dry Psychological: alert and cooperative; normal mood and affect  Results for orders placed or performed during the hospital encounter of 02/02/17  Infectious mono screen, POC  Result Value Ref Range   Mono Screen NEGATIVE NEGATIVE    Labs Reviewed    POCT INFECTIOUS MONO SCREEN    No Known Allergies  Past Medical History:  Diagnosis Date  . Asthma    Social History   Social History  . Marital status: Single    Spouse name: N/A  . Number of children: N/A  . Years of education: N/A   Occupational History  . Not on file.   Social History Main Topics  . Smoking status: Never Smoker  . Smokeless tobacco: Never Used  . Alcohol use No  . Drug use: No  . Sexual activity: Not on file   Other Topics Concern  . Not on file   Social History Narrative  . No narrative on file           Mardella Layman, MD 02/03/17 [phone]

## 2017-08-23 DIAGNOSIS — M545 Low back pain: Secondary | ICD-10-CM | POA: Diagnosis not present

## 2017-08-23 DIAGNOSIS — M9903 Segmental and somatic dysfunction of lumbar region: Secondary | ICD-10-CM | POA: Diagnosis not present

## 2017-08-23 DIAGNOSIS — M9901 Segmental and somatic dysfunction of cervical region: Secondary | ICD-10-CM | POA: Diagnosis not present

## 2017-12-13 ENCOUNTER — Ambulatory Visit (HOSPITAL_COMMUNITY)
Admission: EM | Admit: 2017-12-13 | Discharge: 2017-12-13 | Disposition: A | Discharge status: Home / Self Care | Payer: 59 | Attending: Physician Assistant | Admitting: Physician Assistant

## 2017-12-13 ENCOUNTER — Ambulatory Visit (INDEPENDENT_AMBULATORY_CARE_PROVIDER_SITE_OTHER): Payer: 59

## 2017-12-13 ENCOUNTER — Encounter (HOSPITAL_COMMUNITY): Payer: Self-pay

## 2017-12-13 DIAGNOSIS — S0081XA Abrasion of other part of head, initial encounter: Secondary | ICD-10-CM | POA: Diagnosis not present

## 2017-12-13 DIAGNOSIS — S51851A Open bite of right forearm, initial encounter: Secondary | ICD-10-CM

## 2017-12-13 DIAGNOSIS — Z23 Encounter for immunization: Secondary | ICD-10-CM | POA: Diagnosis not present

## 2017-12-13 DIAGNOSIS — S59911A Unspecified injury of right forearm, initial encounter: Secondary | ICD-10-CM | POA: Diagnosis not present

## 2017-12-13 DIAGNOSIS — W503XXA Accidental bite by another person, initial encounter: Secondary | ICD-10-CM | POA: Diagnosis not present

## 2017-12-13 DIAGNOSIS — M7989 Other specified soft tissue disorders: Secondary | ICD-10-CM | POA: Diagnosis not present

## 2017-12-13 MED ORDER — TETANUS-DIPHTH-ACELL PERTUSSIS 5-2.5-18.5 LF-MCG/0.5 IM SUSP
INTRAMUSCULAR | Status: AC
  Filled 2017-12-13: qty 0.5

## 2017-12-13 MED ORDER — AMOXICILLIN-POT CLAVULANATE 875-125 MG PO TABS: 1.0000 | ORAL_TABLET | Freq: Two times a day (BID) | ORAL | 0 refills | Status: DC

## 2017-12-13 MED ORDER — TETANUS-DIPHTH-ACELL PERTUSSIS 5-2.5-18.5 LF-MCG/0.5 IM SUSP
0.5000 mL | Freq: Once | INTRAMUSCULAR | Status: AC
  Administered 2017-12-13: 0.5 mL via INTRAMUSCULAR
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
## 2017-12-13 NOTE — Discharge Instructions (Signed)
Return if any problems.
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
## 2017-12-13 NOTE — ED Notes (Signed)
Pt discharged by provider.

## 2018-01-31 ENCOUNTER — Encounter: Payer: Self-pay | Admitting: Nurse Practitioner

## 2018-01-31 ENCOUNTER — Ambulatory Visit (INDEPENDENT_AMBULATORY_CARE_PROVIDER_SITE_OTHER): Payer: 59 | Admitting: Nurse Practitioner

## 2018-01-31 VITALS — BP 120/84 | HR 93 | Temp 98.2°F | Ht 60.0 in | Wt 180.0 lb

## 2018-01-31 DIAGNOSIS — Z0000 Encounter for general adult medical examination without abnormal findings: Secondary | ICD-10-CM | POA: Diagnosis not present

## 2018-01-31 DIAGNOSIS — Z136 Encounter for screening for cardiovascular disorders: Secondary | ICD-10-CM

## 2018-01-31 DIAGNOSIS — Z13220 Encounter for screening for lipoid disorders: Secondary | ICD-10-CM | POA: Diagnosis not present

## 2018-01-31 LAB — CBC
HEMATOCRIT: 38 % (ref 36.0–46.0)
HEMOGLOBIN: 12.7 g/dL (ref 12.0–15.0)
MCHC: 33.4 g/dL (ref 30.0–36.0)
MCV: 83.8 fl (ref 78.0–100.0)
Platelets: 347 10*3/uL (ref 150.0–400.0)
RBC: 4.54 Mil/uL (ref 3.87–5.11)
RDW: 14.1 % (ref 11.5–15.5)
WBC: 6.4 10*3/uL (ref 4.0–10.5)

## 2018-01-31 LAB — LIPID PANEL
CHOL/HDL RATIO: 5
Cholesterol: 172 mg/dL (ref 0–200)
HDL: 34.2 mg/dL — AB (ref >39.00)
LDL CALC: 105 mg/dL — AB (ref 0–99)
NonHDL: 137.94
Triglycerides: 164 mg/dL — ABNORMAL HIGH (ref 0.0–149.0)
VLDL: 32.8 mg/dL (ref 0.0–40.0)

## 2018-01-31 LAB — COMPREHENSIVE METABOLIC PANEL
ALT: 26 U/L (ref 0–35)
AST: 20 U/L (ref 0–37)
Albumin: 4.4 g/dL (ref 3.5–5.2)
Alkaline Phosphatase: 66 U/L (ref 39–117)
BUN: 14 mg/dL (ref 6–23)
CO2: 27 meq/L (ref 19–32)
Calcium: 9.8 mg/dL (ref 8.4–10.5)
Chloride: 104 mEq/L (ref 96–112)
Creatinine, Ser: 0.67 mg/dL (ref 0.40–1.20)
GFR: 113.71 mL/min (ref >60.00)
GLUCOSE: 89 mg/dL (ref 70–99)
POTASSIUM: 3.8 meq/L (ref 3.5–5.1)
Sodium: 139 mEq/L (ref 135–145)
Total Bilirubin: 0.4 mg/dL (ref 0.2–1.2)
Total Protein: 7.3 g/dL (ref 6.0–8.3)

## 2018-01-31 LAB — TSH: TSH: 2.27 u[IU]/mL (ref 0.35–4.50)

## 2018-01-31 NOTE — Progress Notes (Signed)
Subjective:    Patient ID: Tara Joseph, female    DOB: April 22, 1991, 27 y.o.   MRN: [id number]  Patient presents today for complete physical and to establish care  HPI   denies any acute complains.  Sexual History (orientation,birth control, marital status, STD):never been sexually active  Depression/Suicide: Depression screen [facility] 2/9 01/31/2018  Decreased Interest 0  Down, Depressed, Hopeless 0  PHQ - 2 Score 0   No flowsheet data found.  Vision: not needed  Dental:will schedule  Immunizations: (TDAP, Hep C screen, Pneumovax, Influenza, zoster)  Health Maintenance  Topic Date Due  . Flu Shot  12/02/2017  . Pap Smear  01/31/2018*  . HIV Screening  02/01/2019*  . Tetanus Vaccine  12/03/2027  *Topic was postponed. The date shown is not the original due date.   Diet:regular.  Weight:  Wt Readings from Last 3 Encounters:  01/31/18 180 lb (81.6 kg)  05/28/16 170 lb 1 oz (77.1 kg)  03/29/16 150 lb (68 kg)    Exercise:none  Fall Risk: Fall Risk  01/31/2018  Falls in the past year? No   Home Safety:home with mother  Medications and allergies reviewed with patient and updated if appropriate.  There are no active problems to display for this patient.   No current outpatient medications on file prior to visit.   No current facility-administered medications on file prior to visit.     Past Medical History:  Diagnosis Date  . Asthma     History reviewed. No pertinent surgical history.  Social History   Socioeconomic History  . Marital status: Single    Spouse name: Not on file  . Number of children: Not on file  . Years of education: Not on file  . Highest education level: Not on file  Occupational History  . Not on file  Social Needs  . Financial resource strain: Not on file  . Food insecurity:    Worry: Not on file    Inability: Not on file  . Transportation needs:    Medical: Not on file    Non-medical: Not on file  Tobacco Use  . Smoking  status: Never Smoker  . Smokeless tobacco: Never Used  Substance and Sexual Activity  . Alcohol use: No  . Drug use: No  . Sexual activity: Never  Lifestyle  . Physical activity:    Days per week: Not on file    Minutes per session: Not on file  . Stress: Not on file  Relationships  . Social connections:    Talks on phone: Not on file    Gets together: Not on file    Attends religious service: Not on file    Active member of club or organization: Not on file    Attends meetings of clubs or organizations: Not on file    Relationship status: Not on file  Other Topics Concern  . Not on file  Social History Narrative  . Not on file    Family History  Problem Relation Age of Onset  . Hypertension Mother   . Thyroid disease Mother   . Diabetes Father   . Hypertension Brother   . Arthritis Maternal Grandmother   . Hypertension Brother         Review of Systems  Constitutional: Negative for fever, malaise/fatigue and weight loss.  HENT: Negative for congestion and sore throat.   Eyes:       Negative for visual changes  Respiratory: Negative for cough and shortness of  breath.   Cardiovascular: Negative for chest pain, palpitations and leg swelling.  Gastrointestinal: Negative for blood in stool, constipation, diarrhea and heartburn.  Genitourinary: Negative for dysuria, frequency and urgency.  Musculoskeletal: Negative for falls, joint pain and myalgias.  Skin: Negative for rash.  Neurological: Negative for dizziness, sensory change and headaches.  Endo/Heme/Allergies: Does not bruise/bleed easily.  Psychiatric/Behavioral: Negative for depression, substance abuse and suicidal ideas. The patient is not nervous/anxious.     Objective:   Vitals:   01/31/18 0915  BP: 120/84  Pulse: 93  Temp: 98.2 F (36.8 C)  SpO2: 98%    Body mass index is 35.15 kg/m.   Physical Examination:  Physical Exam  Constitutional: She is oriented to person, place, and time. She  appears well-developed and well-nourished.  HENT:  Right Ear: External ear normal.  Left Ear: External ear normal.  Nose: Nose normal.  Mouth/Throat: Oropharynx is clear and moist.  Eyes: Pupils are equal, round, and reactive to light. Conjunctivae and EOM are normal.  Neck: Normal range of motion. Neck supple. No thyromegaly present.  Cardiovascular: Normal rate, regular rhythm, normal heart sounds and intact distal pulses.  Pulmonary/Chest: Effort normal and breath sounds normal. Right breast exhibits no inverted nipple, no mass, no nipple discharge, no skin change and no tenderness. Left breast exhibits no inverted nipple, no mass, no nipple discharge, no skin change and no tenderness. Breasts are symmetrical.  Abdominal: Soft. Bowel sounds are normal.  Genitourinary:  Genitourinary Comments: declined  Lymphadenopathy:    She has no cervical adenopathy.  Neurological: She is alert and oriented to person, place, and time.  Skin: Skin is warm and dry. No rash noted.  Psychiatric: She has a normal mood and affect. Her behavior is normal. Thought content normal.  Vitals reviewed.   ASSESSMENT and PLAN:  Lile was seen today for establish care.  Diagnoses and all orders for this visit:  Preventative health care -     CBC -     Comprehensive metabolic panel -     TSH -     Lipid panel  Encounter for lipid screening for cardiovascular disease -     Lipid panel    No problem-specific Assessment & Plan notes found for this encounter.     Follow up: Return if symptoms worsen or fail to improve.  Alysia Penna, NP

## 2018-01-31 NOTE — Patient Instructions (Addendum)
Go to lab for blood draw.  Human Papillomavirus Quadrivalent Vaccine suspension for injection What is this medicine? HUMAN PAPILLOMAVIRUS VACCINE (HYOO muhn pap uh LOH muh vahy ruhs vak SEEN) is a vaccine. It is used to prevent infections of four types of the human papillomavirus. In women, the vaccine may lower your risk of getting cervical, vaginal, vulvar, or anal cancer and genital warts. In men, the vaccine may lower your risk of getting genital warts and anal cancer. You cannot get these diseases from the vaccine. This vaccine does not treat these diseases. This medicine may be used for other purposes; ask your health care provider or pharmacist if you have questions. COMMON BRAND NAME(S): Gardasil What should I tell my health care provider before I take this medicine? They need to know if you have any of these conditions: -fever or infection -hemophilia -HIV infection or AIDS -immune system problems -low platelet count -an unusual reaction to Human Papillomavirus Vaccine, yeast, other medicines, foods, dyes, or preservatives -pregnant or trying to get pregnant -breast-feeding How should I use this medicine? This vaccine is for injection in a muscle on your upper arm or thigh. It is given by a health care professional. Dennis Bast will be observed for 15 minutes after each dose. Sometimes, fainting happens after the vaccine is given. You may be asked to sit or lie down during the 15 minutes. Three doses are given. The second dose is given 2 months after the first dose. The last dose is given 4 months after the second dose. A copy of a Vaccine Information Statement will be given before each vaccination. Read this sheet carefully each time. The sheet may change frequently. Talk to your pediatrician regarding the use of this medicine in children. While this drug may be prescribed for children as young as 2 years of age for selected conditions, precautions do apply. Overdosage: If you think you have  taken too much of this medicine contact a poison control center or emergency room at once. NOTE: This medicine is only for you. Do not share this medicine with others. What if I miss a dose? All 3 doses of the vaccine should be given within 6 months. Remember to keep appointments for follow-up doses. Your health care provider will tell you when to return for the next vaccine. Ask your health care professional for advice if you are unable to keep an appointment or miss a scheduled dose. What may interact with this medicine? -other vaccines This list may not describe all possible interactions. Give your health care provider a list of all the medicines, herbs, non-prescription drugs, or dietary supplements you use. Also tell them if you smoke, drink alcohol, or use illegal drugs. Some items may interact with your medicine. What should I watch for while using this medicine? This vaccine may not fully protect everyone. Continue to have regular pelvic exams and cervical or anal cancer screenings as directed by your doctor. The Human Papillomavirus is a sexually transmitted disease. It can be passed by any kind of sexual activity that involves genital contact. The vaccine works best when given before you have any contact with the virus. Many people who have the virus do not have any signs or symptoms. Tell your doctor or health care professional if you have any reaction or unusual symptom after getting the vaccine. What side effects may I notice from receiving this medicine? Side effects that you should report to your doctor or health care professional as soon as possible: -allergic reactions  like skin rash, itching or hives, swelling of the face, lips, or tongue -breathing problems -feeling faint or lightheaded, falls Side effects that usually do not require medical attention (report to your doctor or health care professional if they continue or are  bothersome): -cough -dizziness -fever -headache -nausea -redness, warmth, swelling, pain, or itching at site where injected This list may not describe all possible side effects. Call your doctor for medical advice about side effects. You may report side effects to FDA at 1-800-FDA-1088. Where should I keep my medicine? This drug is given in a hospital or clinic and will not be stored at home. NOTE: This sheet is a summary. It may not cover all possible information. If you have questions about this medicine, talk to your doctor, pharmacist, or health care provider.  2018 Elsevier/Gold Standard (2013-06-12 13:14:33)   Preventive Care for [place], Female The transition to life after high school as a young adult can be a stressful time with many changes. You may start seeing a primary care physician instead of a pediatrician. This is the time when your health care becomes your responsibility. Preventive care refers to lifestyle choices and visits with your health care provider that can promote health and wellness. What does preventive care include?  A yearly physical exam. This is also called an annual wellness visit.  Dental exams once or twice a year.  Routine eye exams. Ask your health care provider how often you should have your eyes checked.  Personal lifestyle choices, including: ? Daily care of your teeth and gums. ? Regular physical activity. ? Eating a healthy diet. ? Avoiding tobacco and drug use. ? Avoiding or limiting alcohol use. ? Practicing safe sex. ? Taking vitamin and mineral supplements as recommended by your health care provider. What happens during an annual wellness visit? Preventive care starts with a yearly visit to your primary care physician. The services and screenings done by your health care provider during your annual wellness visit will depend on your overall health, lifestyle risk factors, and family history of disease. Counseling Your health care  provider may ask you questions about:  Past medical problems and your family's medical history.  Medicines or supplements you take.  Health insurance and access to health care.  Alcohol, tobacco, and drug use.  Your safety at home, work, or school.  Access to firearms.  Emotional well-being and how you cope with stress.  Relationship well-being.  Diet, exercise, and sleep habits.  Your sexual health and activity.  Your methods of birth control.  Your menstrual cycle.  Your pregnancy history.  Screening You may have the following tests or measurements:  Height, weight, and BMI.  Blood pressure.  Lipid and cholesterol levels.  Tuberculosis skin test.  Skin exam.  Vision and hearing tests.  Screening test for hepatitis.  Screening tests for sexually transmitted diseases (STDs), if you are at risk.  BRCA-related cancer screening. This may be done if you have a family history of breast, ovarian, tubal, or peritoneal cancers.  Pelvic exam and Pap test. This may be done every 3 years starting at age 78.  Vaccines Your health care provider may recommend certain vaccines, such as:  Influenza vaccine. This is recommended every year.  Tetanus, diphtheria, and acellular pertussis (Tdap, Td) vaccine. You may need a Td booster every 10 years.  Varicella vaccine. You may need this if you have not been vaccinated.  HPV vaccine. If you are 73 or younger, you may need three doses  over 6 months.  Measles, mumps, and rubella (MMR) vaccine. You may need at least one dose of MMR. You may also need a second dose.  Pneumococcal 13-valent conjugate (PCV13) vaccine. You may need this if you have certain conditions and were not previously vaccinated.  Pneumococcal polysaccharide (PPSV23) vaccine. You may need one or two doses if you smoke cigarettes or if you have certain conditions.  Meningococcal vaccine. One dose is recommended if you are age 48-21 years and a first-year  college student living in a residence hall, or if you have one of several medical conditions. You may also need additional booster doses.  Hepatitis A vaccine. You may need this if you have certain conditions or if you travel or work in places where you may be exposed to hepatitis A.  Hepatitis B vaccine. You may need this if you have certain conditions or if you travel or work in places where you may be exposed to hepatitis B.  Haemophilus influenzae type b (Hib) vaccine. You may need this if you have certain risk factors.  Talk to your health care provider about which screenings and vaccines you need and how often you need them. What steps can I take to develop healthy behaviors?  Have regular preventive health care visits with your primary care physician and dentist.  Eat a healthy diet.  Drink enough fluid to keep your urine clear or pale yellow.  Stay active. Exercise at least 30 minutes 5 or more days of the week.  Use alcohol responsibly.  Maintain a healthy weight.  Do not use any products that contain nicotine, such as cigarettes, chewing tobacco, and e-cigarettes. If you need help quitting, ask your health care provider.  Do not use drugs.  Practice safe sex.  Use birth control (contraception) to prevent unwanted pregnancy. If you plan to become pregnant, see your health care provider for a pre-conception visit.  Find healthy ways to manage stress. How can I protect myself from injury? Injuries from violence or accidents are the leading cause of death among young adults and can often be prevented. Take these steps to help protect yourself:  Always wear your seat belt while driving or riding in a vehicle.  Do not drive if you have been drinking alcohol. Do not ride with someone who has been drinking.  Do not drive when you are tired or distracted. Do not text while driving.  Wear a helmet and other protective equipment during sports activities.  If you have  firearms in your house, make sure you follow all gun safety procedures.  Seek help if you have been bullied, physically abused, or sexually abused.  Use the Internet responsibly to avoid dangers such as online bullying and online sexual predators.  What can I do to cope with stress? Young adults may face many new challenges that can be stressful, such as finding a job, going to college, moving away from home, managing money, being in a relationship, getting married, and having children. To manage stress:  Avoid known stressful situations when you can.  Exercise regularly.  Find a stress-reducing activity that works best for you. Examples include meditation, yoga, listening to music, or reading.  Spend time in nature.  Keep a journal to write about your stress and how you respond.  Talk to your health care provider about stress. He or she may suggest counseling.  Spend time with supportive friends or family.  Do not cope with stress by: ? Drinking alcohol or using drugs. ?  Smoking cigarettes. ? Eating.  Where can I get more information? Learn more about preventive care and healthy habits from:  [place] and Gynecologists: KaraokeExchange.nl  U.S. Probation officer Task Force: StageSync.si  National Adolescent and [place]: StrategicRoad.nl  American Academy of Pediatrics Bright Futures: https://brightfutures.MemberVerification.co.za  Society for Adolescent Health and Medicine: MoralBlog.co.za.aspx  PodExchange.nl: ToyLending.fr  This information is not intended to replace advice given to you by your health care provider. Make sure you discuss any questions you have with your health care provider. Document Released:  09/05/2015 Document Revised: 09/26/2015 Document Reviewed: 09/05/2015 Elsevier Interactive Patient Education  [employer].

## 2018-02-04 ENCOUNTER — Telehealth: Payer: Self-pay | Admitting: Nurse Practitioner

## 2018-02-04 NOTE — Telephone Encounter (Signed)
Pt given results and recommendations per notes of Alysia Penna, NP on 01/31/18. Pt verbalized understanding.Unable to document in result note due to result note.

## 2018-04-28 ENCOUNTER — Ambulatory Visit (HOSPITAL_COMMUNITY)
Admission: EM | Admit: 2018-04-28 | Discharge: 2018-04-28 | Disposition: A | Discharge status: Home / Self Care | Payer: 59 | Attending: Family Medicine | Admitting: Family Medicine

## 2018-04-28 ENCOUNTER — Encounter (HOSPITAL_COMMUNITY): Payer: Self-pay | Admitting: Family Medicine

## 2018-04-28 DIAGNOSIS — M5431 Sciatica, right side: Secondary | ICD-10-CM | POA: Diagnosis not present

## 2018-04-28 MED ORDER — PREDNISONE 10 MG (21) PO TBPK: ORAL_TABLET | Freq: Every day | ORAL | 0 refills | Status: DC

## 2018-04-28 NOTE — Discharge Instructions (Signed)
You may also use over the counter ibuprofen or acetaminophen as needed for pain.

## 2018-04-28 NOTE — ED Triage Notes (Signed)
Pt presents with complaints of right sided gluteal pain that radiates down her leg x "months". Pt reports on and off toe numbness on the right side. Pt also complains of right sided shoulder pain. Denies any injury. Ambulatory with steady gait.

## 2018-04-30 NOTE — ED Provider Notes (Signed)
[facility]   [id number] 04/28/18 Arrival Time: 1503  ASSESSMENT & PLAN:  1. Sciatica of right side   Normal neurological exam. No indication for imaging of her spine at this time. Discussed  Continue OTC ibuprofen. Will start: Meds ordered this encounter  Medications  . predniSONE (STERAPRED UNI-PAK 21 TAB) 10 MG (21) TBPK tablet    Sig: Take by mouth daily. Take as directed.    Dispense:  21 tablet    Refill:  0   Follow-up Information    Schedule an appointment as soon as possible for a visit  with [facility].   Specialty:  Specialist Why:  If symptoms worsen. Contact information: [address] [place] [id number] [phone]          Reviewed expectations re: course of current medical issues. Questions answered. Outlined signs and symptoms indicating need for more acute intervention. Patient verbalized understanding. After Visit Summary given.   SUBJECTIVE: History from: patient.  Tara Joseph is a 27 y.o. female who presents with complaint of intermittent right sided lower back/buttock discomfort. Onset gradual, several months ago. No significant worsening. Injury/trama: no. History of back problems: no. Discomfort described as aching with occasional sharp pain that radiates down her posterior right leg; few seconds then eases off. Certain movements exacerbate the described discomfort. Better with rest. Extremity sensation changes or weakness: none except for occasional "tingling" in toes of right foot. Ambulatory without difficulty. Normal bowel/bladder habits: yes. No associated abdominal pain/n/v. Self treatment: has OTC analgesics without much help.  Reports no fevers, IV drug use, or recent back surgeries or procedures.  ROS: As per HPI. All other systems negative.    OBJECTIVE:  Vitals:   04/28/18 1608  BP: 124/77  Pulse: (!) 107  Resp: 20  Temp: 99 F (37.2 C)  SpO2: 97%    Recheck pulse: 98 General appearance:  alert; no distress Neck: supple with FROM; without midline tenderness CV: RRR without murmer Lungs: unlabored respirations; symmetrical air entry Abdomen: soft, non-tender; non-distended Back: moderate right sided tenderness of her lower right back/SI joint/upper buttock that is poorly localized; FROM at waist; bruising: none; without midline tenderness Extremities: no edema; symmetrical with no gross deformities; normal ROM of bilateral lower extremities Skin: warm and dry Neurologic: normal gait; normal reflexes of RLE and LLE; normal sensation of RLE and LLE; normal strength of RLE and LLE; pain reported with SLR on R Psychological: alert and cooperative; normal mood and affect  No Known Allergies  Past Medical History:  Diagnosis Date  . Asthma   . Migraines   . UTI (urinary tract infection)    Social History   Socioeconomic History  . Marital status: Single    Spouse name: Not on file  . Number of children: Not on file  . Years of education: Not on file  . Highest education level: Not on file  Occupational History  . Not on file  Social Needs  . Financial resource strain: Not on file  . Food insecurity:    Worry: Not on file    Inability: Not on file  . Transportation needs:    Medical: Not on file    Non-medical: Not on file  Tobacco Use  . Smoking status: Never Smoker  . Smokeless tobacco: Never Used  Substance and Sexual Activity  . Alcohol use: No  . Drug use: No  . Sexual activity: Never  Lifestyle  . Physical activity:    Days per week: Not  on file    Minutes per session: Not on file  . Stress: Not on file  Relationships  . Social connections:    Talks on phone: Not on file    Gets together: Not on file    Attends religious service: Not on file    Active member of club or organization: Not on file    Attends meetings of clubs or organizations: Not on file    Relationship status: Not on file  . Intimate partner violence:    Fear of current or ex  partner: Not on file    Emotionally abused: Not on file    Physically abused: Not on file    Forced sexual activity: Not on file  Other Topics Concern  . Not on file  Social History Narrative  . Not on file   Family History  Problem Relation Age of Onset  . Hypertension Mother   . Thyroid disease Mother   . Diabetes Father   . Hypertension Brother   . Arthritis Maternal Grandmother   . Hypertension Brother    History reviewed. No pertinent surgical history.   Mardella LaymanHagler, Caylen Kuwahara, MD 04/30/18 [id number]

## 2018-08-17 ENCOUNTER — Telehealth: Payer: Self-pay | Admitting: Nurse Practitioner

## 2018-08-17 NOTE — Telephone Encounter (Signed)
I called and left message on voicemail to call office per Alysia Penna request to schedule CPE (fasting) for September 2020.

## 2019-01-23 ENCOUNTER — Encounter: Payer: Self-pay | Admitting: Emergency Medicine

## 2019-01-23 ENCOUNTER — Ambulatory Visit
Admission: EM | Admit: 2019-01-23 | Discharge: 2019-01-23 | Disposition: A | Discharge status: Home / Self Care | Payer: 59 | Attending: Emergency Medicine | Admitting: Emergency Medicine

## 2019-01-23 ENCOUNTER — Other Ambulatory Visit: Payer: Self-pay

## 2019-01-23 DIAGNOSIS — M62838 Other muscle spasm: Secondary | ICD-10-CM

## 2019-01-23 MED ORDER — CYCLOBENZAPRINE HCL 5 MG PO TABS: 5.0000 mg | ORAL_TABLET | Freq: Two times a day (BID) | ORAL | 0 refills | Status: DC | PRN

## 2019-01-23 MED ORDER — DEXAMETHASONE SODIUM PHOSPHATE 10 MG/ML IJ SOLN
10.0000 mg | Freq: Once | INTRAMUSCULAR | Status: AC
  Administered 2019-01-23: 20:00:00 10 mg via INTRAMUSCULAR

## 2019-01-23 MED ORDER — KETOROLAC TROMETHAMINE 60 MG/2ML IM SOLN
60.0000 mg | Freq: Once | INTRAMUSCULAR | Status: AC
  Administered 2019-01-23: 60 mg via INTRAMUSCULAR

## 2019-01-23 NOTE — ED Triage Notes (Signed)
Pt presents to [facility] for assessment of neck and left sided headache x 2-3 days.  States she woke up to neck pain one morning, and then the headache followed.  Has tried Aleve at home without much relief.  Hx of migraines.

## 2019-01-23 NOTE — Discharge Instructions (Signed)
Recommend taking 2 tabs of OTC Aleve (220 mg per tab) twice daily with food in conjunction with your muscle relaxer. Take muscle relaxer as needed for severe pain, spasm. May ice, rest, elevate the area(s) of pain.  You may also use hot compresses/warm wash rags to relieve muscle tightness. May use OTC Tylenol, ibuprofen as needed for pain. Return if you develop worsening pain, arm numbness, severe headache.

## 2019-01-23 NOTE — ED Notes (Signed)
Patient able to ambulate independently  

## 2019-01-23 NOTE — ED Provider Notes (Signed)
[facility]    CSN: [id number] Arrival date & time: 01/23/19  1833      History   Chief Complaint Chief Complaint  Patient presents with  . Headache  . Neck Pain    HPI Tara Joseph is a 28 y.o. female with history of asthma, migraines presenting for left-sided upper back/neck pain and headache.  Patient states she woke up to 3 days ago with left-sided neck pain with difficulty turning head to affected side.  Since then has slowly developed a headache for which she tried Aleve without significant relief.  Patient states this is different from her typical migraines.  Patient denies fever, nausea, vomiting, severe abdominal pain, change in vision, trauma to the area.   Past Medical History:  Diagnosis Date  . Asthma   . Migraines   . UTI (urinary tract infection)     There are no active problems to display for this patient.   History reviewed. No pertinent surgical history.  OB History   No obstetric history on file.      Home Medications    Prior to Admission medications   Medication Sig Start Date End Date Taking? Authorizing Provider  cyclobenzaprine (FLEXERIL) 5 MG tablet Take 1 tablet (5 mg total) by mouth 2 (two) times daily as needed for muscle spasms. 01/23/19   Hall-Potvin, [place], PA-C    Family History Family History  Problem Relation Age of Onset  . Hypertension Mother   . Thyroid disease Mother   . Diabetes Father   . Hypertension Brother   . Arthritis Maternal Grandmother   . Hypertension Brother     Social History Social History   Tobacco Use  . Smoking status: Never Smoker  . Smokeless tobacco: Never Used  Substance Use Topics  . Alcohol use: No  . Drug use: No     Allergies   Patient has no known allergies.   Review of Systems Review of Systems  Constitutional: Negative for fatigue and fever.  HENT: Negative for ear pain, sinus pain, sore throat and voice change.   Eyes: Negative for pain, redness and visual  disturbance.  Respiratory: Negative for cough and shortness of breath.   Cardiovascular: Negative for chest pain and palpitations.  Gastrointestinal: Negative for abdominal pain, diarrhea and vomiting.  Musculoskeletal: Positive for neck pain and neck stiffness. Negative for arthralgias, back pain, gait problem and myalgias.  Skin: Negative for rash and wound.  Neurological: Positive for headaches. Negative for dizziness, tremors, syncope, facial asymmetry, speech difficulty, weakness, light-headedness and numbness.     Physical Exam Triage Vital Signs ED Triage Vitals  Enc Vitals Group     BP 01/23/19 1843 130/69     Pulse Rate 01/23/19 1843 97     Resp 01/23/19 1843 18     Temp 01/23/19 1843 98.8 F (37.1 C)     Temp Source 01/23/19 1843 Oral     SpO2 01/23/19 1843 98 %     Weight --      Height --      Head Circumference --      Peak Flow --      Pain Score 01/23/19 1844 8     Pain Loc --      Pain Edu? --      Excl. in [place]? --    No data found.  Updated Vital Signs BP 130/69 (BP Location: Left Arm)   Pulse 97   Temp 98.8 F (37.1 C) (Oral)   Resp  18   LMP 12/23/2018   SpO2 98%   Visual Acuity Right Eye Distance:   Left Eye Distance:   Bilateral Distance:    Right Eye Near:   Left Eye Near:    Bilateral Near:     Physical Exam Constitutional:      General: She is not in acute distress.    Appearance: She is well-developed. She is not ill-appearing.  HENT:     Head: Normocephalic and atraumatic.     Mouth/Throat:     Mouth: Mucous membranes are moist.     Pharynx: Oropharynx is clear.  Eyes:     General: No visual field deficit or scleral icterus.    Extraocular Movements: Extraocular movements intact.     Right eye: Normal extraocular motion and no nystagmus.     Left eye: Normal extraocular motion and no nystagmus.     Pupils: Pupils are equal, round, and reactive to light. Pupils are equal.  Neck:     Musculoskeletal: Neck supple.     Meningeal:  Brudzinski's sign absent.     Comments: Decreased active ROM, though passive ROM intact.  Patient endorsing pain when turning head to affected side (left). Cardiovascular:     Rate and Rhythm: Normal rate and regular rhythm.     Heart sounds: No murmur. No gallop.   Pulmonary:     Effort: Pulmonary effort is normal. No respiratory distress.     Breath sounds: No wheezing.  Musculoskeletal:     Comments: No bony deformity of C-spine.  No spinous process tenderness.  TTP of left neck arch/left paraspinal muscles cervical spine, superior aspect of trapezius.  Lymphadenopathy:     Cervical: No cervical adenopathy.  Skin:    General: Skin is warm.     Capillary Refill: Capillary refill takes less than 2 seconds.     Coloration: Skin is not cyanotic, jaundiced or pale.     Findings: No erythema or rash.  Neurological:     Mental Status: She is alert and oriented to person, place, and time.     GCS: GCS eye subscore is 4. GCS verbal subscore is 5. GCS motor subscore is 6.     Cranial Nerves: No cranial nerve deficit, dysarthria or facial asymmetry.     Sensory: No sensory deficit.     Coordination: Romberg sign negative. Coordination normal.     Gait: Gait normal.     Deep Tendon Reflexes: Reflexes normal.  Psychiatric:        Mood and Affect: Mood normal. Mood is not anxious.        Speech: Speech normal.      UC Treatments / Results  Labs (all labs ordered are listed, but only abnormal results are displayed) Labs Reviewed - No data to display  EKG   Radiology No results found.  Procedures Procedures (including critical care time)  Medications Ordered in UC Medications  ketorolac (TORADOL) injection 60 mg (60 mg Intramuscular Given 01/23/19 1940)  dexamethasone (DECADRON) injection 10 mg (10 mg Intramuscular Given 01/23/19 1940)    Initial Impression / Assessment and Plan / UC Course  I have reviewed the triage vital signs and the nursing notes.  Pertinent labs &  imaging results that were available during my care of the patient were reviewed by me and considered in my medical decision making (see chart for details).     1.  Muscle spasm No neurocognitive deficits on exam.  Patient is afebrile, nontoxic: Low concern for  meningitis.  Will increase daily dose of Aleve, add muscle relaxer as adjuvant therapy.  Patient given IM Toradol, Decadron in office which he tolerated well.  Return precautions discussed, patient verbalized understanding and is agreeable to plan. Final Clinical Impressions(s) / UC Diagnoses   Final diagnoses:  Muscle spasm     Discharge Instructions     Recommend taking 2 tabs of OTC Aleve (220 mg per tab) twice daily with food in conjunction with your muscle relaxer. Take muscle relaxer as needed for severe pain, spasm. May ice, rest, elevate the area(s) of pain.  You may also use hot compresses/warm wash rags to relieve muscle tightness. May use OTC Tylenol, ibuprofen as needed for pain. Return if you develop worsening pain, arm numbness, severe headache.    ED Prescriptions    Medication Sig Dispense Auth. Provider   cyclobenzaprine (FLEXERIL) 5 MG tablet Take 1 tablet (5 mg total) by mouth 2 (two) times daily as needed for muscle spasms. 14 tablet Hall-Potvin, [place], PA-C     PDMP not reviewed this encounter.   Hall-Potvin, [place], [place] 01/23/19 2039

## 2019-01-31 ENCOUNTER — Encounter: Payer: Self-pay | Admitting: Emergency Medicine

## 2019-01-31 ENCOUNTER — Ambulatory Visit
Admission: EM | Admit: 2019-01-31 | Discharge: 2019-01-31 | Disposition: A | Discharge status: Home / Self Care | Payer: Self-pay | Attending: Physician Assistant | Admitting: Physician Assistant

## 2019-01-31 ENCOUNTER — Other Ambulatory Visit: Payer: Self-pay

## 2019-01-31 DIAGNOSIS — J209 Acute bronchitis, unspecified: Secondary | ICD-10-CM

## 2019-01-31 DIAGNOSIS — M542 Cervicalgia: Secondary | ICD-10-CM

## 2019-01-31 DIAGNOSIS — G44201 Tension-type headache, unspecified, intractable: Secondary | ICD-10-CM

## 2019-01-31 DIAGNOSIS — R05 Cough: Secondary | ICD-10-CM

## 2019-01-31 DIAGNOSIS — Z20828 Contact with and (suspected) exposure to other viral communicable diseases: Secondary | ICD-10-CM

## 2019-01-31 DIAGNOSIS — R059 Cough, unspecified: Secondary | ICD-10-CM

## 2019-01-31 MED ORDER — ALBUTEROL SULFATE HFA 108 (90 BASE) MCG/ACT IN AERS: 1.0000 | INHALATION_SPRAY | Freq: Four times a day (QID) | RESPIRATORY_TRACT | 0 refills | Status: DC | PRN

## 2019-01-31 MED ORDER — CYCLOBENZAPRINE HCL 5 MG PO TABS: 5.0000 mg | ORAL_TABLET | Freq: Two times a day (BID) | ORAL | 0 refills | Status: DC | PRN

## 2019-01-31 MED ORDER — DOXYCYCLINE HYCLATE 100 MG PO CAPS: 100.0000 mg | ORAL_CAPSULE | Freq: Two times a day (BID) | ORAL | 0 refills | Status: DC

## 2019-01-31 MED ORDER — PREDNISONE 50 MG PO TABS: 50.0000 mg | ORAL_TABLET | Freq: Every day | ORAL | 0 refills | Status: DC

## 2019-01-31 MED ORDER — KETOROLAC TROMETHAMINE 15 MG/ML IJ SOLN
15.0000 mg | Freq: Once | INTRAMUSCULAR | Status: AC
  Administered 2019-01-31: 15 mg via INTRAMUSCULAR

## 2019-01-31 NOTE — ED Provider Notes (Signed)
[facility]    CSN: [id number] Arrival date & time: 01/31/19  1855      History   Chief Complaint Chief Complaint  Patient presents with  . Neck Pain  . Headache    HPI Tara Joseph is a 28 y.o. female.   28 year old female comes in for evaluation of continued left neck pain, posterior headache after being seen 01/23/2019.  At that time, she was given Toradol and Decadron injection in office, and prescription of Flexeril.  She states this has helped the symptoms, but after finishing prescription of Flexeril, symptoms returned.  She has also been having 2-week history of cough.  Has had rhinorrhea, subjective fever.  Denies chills.  States she coughs due to "itchy throat".  States headache is worse during coughing.  Denies shortness of breath, loss of taste or smell.  Had positive sick contact, unknown COVID contact.  Former smoker.  Has had to use albuterol inhaler in the past, but has not needed to carry one for many years.  States would have found albuterol inhaler to be useful if she had one.  States left-sided neck pain and posterior headache is now constant, with photophobia, phonophobia.  Denies nausea or vomiting.  States occasionally feels lightheaded when standing.  Denies head injury, loss of consciousness.  Denies weakness, dizziness, syncope.     Past Medical History:  Diagnosis Date  . Asthma   . Migraines   . UTI (urinary tract infection)     There are no active problems to display for this patient.   History reviewed. No pertinent surgical history.  OB History   No obstetric history on file.      Home Medications    Prior to Admission medications   Medication Sig Start Date End Date Taking? Authorizing Provider  albuterol (VENTOLIN HFA) 108 (90 Base) MCG/ACT inhaler Inhale 1-2 puffs into the lungs every 6 (six) hours as needed for wheezing or shortness of breath. 01/31/19   Cathie Hoops, Amy V, PA-C  cyclobenzaprine (FLEXERIL) 5 MG tablet Take 1  tablet (5 mg total) by mouth 2 (two) times daily as needed for muscle spasms. 01/31/19   Cathie Hoops, Amy V, PA-C  doxycycline (VIBRAMYCIN) 100 MG capsule Take 1 capsule (100 mg total) by mouth 2 (two) times daily. 01/31/19   Cathie Hoops, Amy V, PA-C  predniSONE (DELTASONE) 50 MG tablet Take 1 tablet (50 mg total) by mouth daily with breakfast. 01/31/19   Belinda Fisher, PA-C    Family History Family History  Problem Relation Age of Onset  . Hypertension Mother   . Thyroid disease Mother   . Diabetes Father   . Hypertension Brother   . Arthritis Maternal Grandmother   . Hypertension Brother     Social History Social History   Tobacco Use  . Smoking status: Never Smoker  . Smokeless tobacco: Never Used  Substance Use Topics  . Alcohol use: No  . Drug use: No     Allergies   Patient has no known allergies.   Review of Systems Review of Systems  Reason unable to perform ROS: See HPI as above.     Physical Exam Triage Vital Signs ED Triage Vitals [01/31/19 1901]  Enc Vitals Group     BP 136/90     Pulse Rate (!) 109     Resp 20     Temp 98.6 F (37 C)     Temp src      SpO2 96 %  Weight      Height      Head Circumference      Peak Flow      Pain Score 10     Pain Loc      Pain Edu?      Excl. in [place]?    No data found.  Updated Vital Signs BP 136/90 (BP Location: Left Arm)   Pulse (!) 109   Temp 98.6 F (37 C)   Resp 20   SpO2 96%   Physical Exam Constitutional:      General: She is not in acute distress.    Appearance: Normal appearance. She is not ill-appearing, toxic-appearing or diaphoretic.  HENT:     Head: Normocephalic and atraumatic.     Nose:     Right Sinus: No maxillary sinus tenderness or frontal sinus tenderness.     Left Sinus: No maxillary sinus tenderness or frontal sinus tenderness.     Mouth/Throat:     Mouth: Mucous membranes are moist.     Pharynx: Oropharynx is clear. Uvula midline.  Eyes:     Extraocular Movements: Extraocular movements  intact.     Conjunctiva/sclera: Conjunctivae normal.     Pupils: Pupils are equal, round, and reactive to light.     Comments: No photophobia on exam.   Neck:     Musculoskeletal: Normal range of motion and neck supple. Muscular tenderness (left) present. No spinous process tenderness.  Cardiovascular:     Rate and Rhythm: Normal rate and regular rhythm.     Heart sounds: Normal heart sounds. No murmur. No friction rub. No gallop.   Pulmonary:     Effort: Pulmonary effort is normal. No accessory muscle usage, prolonged expiration, respiratory distress or retractions.     Comments: Lungs clear to auscultation without adventitious lung sounds. Coughing throughout exam Musculoskeletal:     Comments: Left middle trapezius muscle pain on palpation.  Full range of motion of neck and shoulder.  Neurological:     General: No focal deficit present.     Mental Status: She is alert and oriented to person, place, and time.     GCS: GCS eye subscore is 4. GCS verbal subscore is 5. GCS motor subscore is 6.     Comments: Can ambulate on own without difficulty.       UC Treatments / Results  Labs (all labs ordered are listed, but only abnormal results are displayed) Labs Reviewed  NOVEL CORONAVIRUS, NAA    EKG   Radiology No results found.  Procedures Procedures (including critical care time)  Medications Ordered in UC Medications - No data to display  Initial Impression / Assessment and Plan / UC Course  I have reviewed the triage vital signs and the nursing notes.  Pertinent labs & imaging results that were available during my care of the patient were reviewed by me and considered in my medical decision making (see chart for details).    Given URI symptoms, will test for COVID.  Patient to remain in quarantine until testing results return.  Will cover for bronchitis with prednisone and doxycycline.  Will provide Toradol injection in office today for headache.  Refill Flexeril for  symptoms.  Return precautions given.  Patient expresses understanding and agrees to plan.  Final Clinical Impressions(s) / UC Diagnoses   Final diagnoses:  Cough  Neck pain on left side  Acute intractable tension-type headache   ED Prescriptions    Medication Sig Dispense Auth. Provider   predniSONE (  DELTASONE) 50 MG tablet Take 1 tablet (50 mg total) by mouth daily with breakfast. 5 tablet Yu, Amy V, PA-C   albuterol (VENTOLIN HFA) 108 (90 Base) MCG/ACT inhaler Inhale 1-2 puffs into the lungs every 6 (six) hours as needed for wheezing or shortness of breath. 8 g Yu, Amy V, PA-C   cyclobenzaprine (FLEXERIL) 5 MG tablet Take 1 tablet (5 mg total) by mouth 2 (two) times daily as needed for muscle spasms. 10 tablet Yu, Amy V, PA-C   doxycycline (VIBRAMYCIN) 100 MG capsule Take 1 capsule (100 mg total) by mouth 2 (two) times daily. 20 capsule Belinda FisherYu, Amy V, PA-C     PDMP not reviewed this encounter.   Belinda FisherYu, Amy V, PA-C 01/31/19 1929

## 2019-01-31 NOTE — Discharge Instructions (Addendum)
As discussed, cannot rule out COVID. Currently, no alarming signs. Testing ordered. I would like you to quarantine until testing results.  Toradol injection given in office today.  Start prednisone and doxycycline as directed for bronchitis.  Albuterol inhaler as needed.  I have refilled her Flexeril for the neck pain as needed.  If experiencing shortness of breath, trouble breathing, call 911 and provide them with your current situation.

## 2019-01-31 NOTE — ED Notes (Signed)
Patient able to ambulate independently  

## 2019-01-31 NOTE — ED Triage Notes (Signed)
Patient presents to [facility] for assessment of left neck pain, posterior headache, which she has had since her last visit to this [facility].  States the pain never went away completely, but the shots given in office last visit, as well as the flexeril she has been taking at home diminishes it.  Patient states she has had a cough x 2 weeks at well, and it seems to make it worse.  States she took 600mg  at noon-ish today without any relief.

## 2019-02-02 ENCOUNTER — Other Ambulatory Visit: Payer: Self-pay

## 2019-02-02 ENCOUNTER — Encounter (HOSPITAL_COMMUNITY): Payer: Self-pay

## 2019-02-02 ENCOUNTER — Emergency Department (HOSPITAL_COMMUNITY): Payer: Self-pay

## 2019-02-02 ENCOUNTER — Telehealth (HOSPITAL_COMMUNITY): Payer: Self-pay | Admitting: Emergency Medicine

## 2019-02-02 ENCOUNTER — Emergency Department (HOSPITAL_COMMUNITY)
Admission: EM | Admit: 2019-02-02 | Discharge: 2019-02-03 | Disposition: A | Discharge status: Home / Self Care | Payer: Self-pay | Attending: Emergency Medicine | Admitting: Emergency Medicine

## 2019-02-02 DIAGNOSIS — R000 Tachycardia, unspecified: Secondary | ICD-10-CM | POA: Insufficient documentation

## 2019-02-02 DIAGNOSIS — J45909 Unspecified asthma, uncomplicated: Secondary | ICD-10-CM | POA: Insufficient documentation

## 2019-02-02 DIAGNOSIS — U071 COVID-19: Secondary | ICD-10-CM | POA: Insufficient documentation

## 2019-02-02 DIAGNOSIS — R519 Headache, unspecified: Secondary | ICD-10-CM

## 2019-02-02 LAB — NOVEL CORONAVIRUS, NAA: SARS-CoV-2, NAA: DETECTED — AB

## 2019-02-02 MED ORDER — METOCLOPRAMIDE HCL 5 MG/ML IJ SOLN
10.0000 mg | Freq: Once | INTRAMUSCULAR | Status: AC
  Administered 2019-02-02: 23:00:00 10 mg via INTRAMUSCULAR
  Filled 2019-02-02: qty 2

## 2019-02-02 MED ORDER — DIPHENHYDRAMINE HCL 25 MG PO CAPS
50.0000 mg | ORAL_CAPSULE | Freq: Once | ORAL | Status: AC
  Administered 2019-02-02: 50 mg via ORAL
  Filled 2019-02-02: qty 2

## 2019-02-02 NOTE — Telephone Encounter (Signed)
Positive covid, Attempted to reach patient. No answer at this time. Voicemail left.

## 2019-02-02 NOTE — ED Notes (Signed)
Patient ambulated in the room. Pt's O2 remained at 99-100% on RA. Patient's HR at rest was 118 but upon walking arouind it was between 139-149

## 2019-02-02 NOTE — ED Triage Notes (Signed)
Pt complains of a headache for two weeks and a cough, she states she was tested for COVID two days ago at Urgent Care with no results

## 2019-02-03 ENCOUNTER — Telehealth (HOSPITAL_COMMUNITY): Payer: Self-pay | Admitting: Emergency Medicine

## 2019-02-03 MED ORDER — BUTALBITAL-APAP-CAFFEINE 50-325-40 MG PO TABS: 1.0000 | ORAL_TABLET | Freq: Four times a day (QID) | ORAL | 0 refills | Status: AC | PRN

## 2019-02-03 NOTE — Discharge Instructions (Signed)
Please read and follow all provided instructions.  Your diagnoses today include:  1. COVID-19   2. Bad headache   3. Tachycardia     Tests performed today include:  Chest x-ray - lungs are clear, no pneumonia noted  Vital signs. See below for your results today.   Medications:  In the Emergency Department you received:  Reglan - antinausea/headache medication  Benadryl - antihistamine to counteract potential side effects of reglan  Take any prescribed medications only as directed.  Additional information:  Follow any educational materials contained in this packet.  You are having a headache. No specific cause was found today for your headache. It may have been a migraine or other cause of headache. Stress, anxiety, fatigue, and depression are common triggers for headaches.   Your headache today does not appear to be life-threatening or require hospitalization, but often the exact cause of headaches is not determined in the emergency department. Therefore, follow-up with your doctor is very important to find out what may have caused your headache and whether or not you need any further diagnostic testing or treatment.   Sometimes headaches can appear benign (not harmful), but then more serious symptoms can develop which should prompt an immediate re-evaluation by your doctor or the emergency department.  BE VERY CAREFUL not to take multiple medicines containing Tylenol (also called acetaminophen). Doing so can lead to an overdose which can damage your liver and cause liver failure and possibly death.   Follow-up instructions: Please follow-up with your primary care provider in the next 3 days for further evaluation of your symptoms.   If your heart rate continues to be fast, you may need further work-up.  We do not see any signs of a blood clot, severe infection called sepsis on your exam tonight.  You may need to have your thyroid checked if you have persistently fast heart  rate.  Return instructions:   Please return to the Emergency Department if you experience worsening symptoms.  Return if the medications do not resolve your headache, if it recurs, or if you have multiple episodes of vomiting or cannot keep down fluids.  Return if you have a change from the usual headache.  RETURN IMMEDIATELY IF you:  Develop a sudden, severe headache  Develop confusion or become poorly responsive or faint  Develop a fever above 100.30F or problem breathing  Have a change in speech, vision, swallowing, or understanding  Develop new weakness, numbness, tingling, incoordination in your arms or legs  Have a seizure  Please return if you have any other emergent concerns.  Additional Information:  Your vital signs today were: BP (!) 142/95    Pulse (!) 109    Temp 99.1 F (37.3 C) (Oral)    Resp 20    LMP 01/31/2019    SpO2 100%  If your blood pressure (BP) was elevated above 135/85 this visit, please have this repeated by your doctor within one month. --------------

## 2019-02-03 NOTE — Telephone Encounter (Signed)
Patient contacted and made aware of  Positive covid  results, all questions answered Gave her Mychart sign up info for work notes

## 2019-02-03 NOTE — ED Provider Notes (Signed)
[facility] DEPT Provider Note   CSN: [id number] Arrival date & time: 02/02/19  1920     History   Chief Complaint No chief complaint on file.   HPI Tara Joseph is a 28 y.o. female.     Patient with history of migraine headaches -- presents to the emergency department with complaint of cough and headache. Patient was seen at urgent care initially on 01/23/2019. She was treated with Toradol/Decadron injection in office and prescription of Flexeril.  She also complains of approximately 2 weeks of cough with subjective fevers.  She states that she had "the flu". The symptoms were worse in the first week but are gradually improving over the past 1 week. Denies shortness of breath, loss of taste or smell. States that she had asthma in the past but this has improved.  Her headache which is worse on the left side of her head down into her shoulder and neck were initially mild over the first week but more constant over the past week.  She has had light and sound sensitivity.  No nausea or vomiting. Patient denies signs of stroke including: facial droop, slurred speech, aphasia, weakness/numbness in extremities, imbalance/trouble walking. Patient denies risk factors for pulmonary embolism including: unilateral leg swelling, history of DVT/PE/other blood clots, use of exogenous hormones, recent immobilizations, recent surgery, recent travel (>4hr segment), malignancy, hemoptysis.  Patient followed up with urgent care on 9/29 and was started on doxycycline, given prescription for prednisone, albuterol inhaler, and additional prescription for Flexeril.  Currently her left shoulder pain is worse with movement and raising of her arms.  It is also worse with coughing.      Past Medical History:  Diagnosis Date  . Asthma   . Migraines   . UTI (urinary tract infection)     There are no active problems to display for this patient.   History reviewed. No pertinent  surgical history.   OB History   No obstetric history on file.      Home Medications    Prior to Admission medications   Medication Sig Start Date End Date Taking? Authorizing Provider  albuterol (VENTOLIN HFA) 108 (90 Base) MCG/ACT inhaler Inhale 1-2 puffs into the lungs every 6 (six) hours as needed for wheezing or shortness of breath. 01/31/19   Tasia Catchings, Amy V, PA-C  butalbital-acetaminophen-caffeine (FIORICET) 50-325-40 MG tablet Take 1-2 tablets by mouth every 6 (six) hours as needed for headache. 02/03/19 02/03/20  Carlisle Cater, PA-C  cyclobenzaprine (FLEXERIL) 5 MG tablet Take 1 tablet (5 mg total) by mouth 2 (two) times daily as needed for muscle spasms. 01/31/19   Tasia Catchings, Amy V, PA-C  doxycycline (VIBRAMYCIN) 100 MG capsule Take 1 capsule (100 mg total) by mouth 2 (two) times daily. 01/31/19   Tasia Catchings, Amy V, PA-C  predniSONE (DELTASONE) 50 MG tablet Take 1 tablet (50 mg total) by mouth daily with breakfast. 01/31/19   Ok Edwards, PA-C    Family History Family History  Problem Relation Age of Onset  . Hypertension Mother   . Thyroid disease Mother   . Diabetes Father   . Hypertension Brother   . Arthritis Maternal Grandmother   . Hypertension Brother     Social History Social History   Tobacco Use  . Smoking status: Never Smoker  . Smokeless tobacco: Never Used  Substance Use Topics  . Alcohol use: No  . Drug use: No     Allergies   Patient has no known allergies.  Review of Systems Review of Systems  Constitutional: Negative for chills and fever.  HENT: Positive for congestion and rhinorrhea. Negative for dental problem and sinus pressure.   Eyes: Positive for photophobia. Negative for discharge, redness and visual disturbance.  Respiratory: Positive for cough. Negative for shortness of breath and wheezing.   Cardiovascular: Negative for chest pain.  Gastrointestinal: Negative for nausea and vomiting.  Musculoskeletal: Positive for neck pain. Negative for gait problem  and neck stiffness.  Skin: Negative for rash.  Neurological: Positive for headaches. Negative for syncope, speech difficulty, weakness, light-headedness and numbness.  Psychiatric/Behavioral: Negative for confusion.     Physical Exam Updated Vital Signs BP (!) 142/95   Pulse (!) 109   Temp 99.1 F (37.3 C) (Oral)   Resp 20   LMP 01/31/2019   SpO2 100%   Physical Exam Vitals signs and nursing note reviewed.  Constitutional:      Appearance: She is well-developed.  HENT:     Head: Normocephalic and atraumatic.     Jaw: No trismus.     Right Ear: Tympanic membrane, ear canal and external ear normal.     Left Ear: Tympanic membrane, ear canal and external ear normal.     Nose: Nose normal. No mucosal edema or rhinorrhea.     Mouth/Throat:     Mouth: Mucous membranes are not dry. No oral lesions.     Pharynx: Uvula midline. No oropharyngeal exudate, posterior oropharyngeal erythema or uvula swelling.     Tonsils: No tonsillar abscesses.  Eyes:     General: Lids are normal.        Right eye: No discharge.        Left eye: No discharge.     Extraocular Movements:     Right eye: No nystagmus.     Left eye: No nystagmus.     Conjunctiva/sclera: Conjunctivae normal.     Pupils: Pupils are equal, round, and reactive to light.  Neck:     Musculoskeletal: Normal range of motion and neck supple.  Cardiovascular:     Rate and Rhythm: Regular rhythm. Tachycardia present.     Heart sounds: Normal heart sounds.  Pulmonary:     Effort: Pulmonary effort is normal. No respiratory distress.     Breath sounds: Normal breath sounds. No wheezing or rales.  Abdominal:     Palpations: Abdomen is soft.     Tenderness: There is no abdominal tenderness.  Musculoskeletal:     Cervical back: She exhibits normal range of motion, no tenderness and no bony tenderness.  Lymphadenopathy:     Cervical: No cervical adenopathy.  Skin:    General: Skin is warm and dry.  Neurological:     Mental  Status: She is alert and oriented to person, place, and time.     GCS: GCS eye subscore is 4. GCS verbal subscore is 5. GCS motor subscore is 6.     Cranial Nerves: No cranial nerve deficit.     Sensory: No sensory deficit.     Coordination: Coordination normal.     Gait: Gait normal.     Deep Tendon Reflexes: Reflexes are normal and symmetric.      ED Treatments / Results  Labs (all labs ordered are listed, but only abnormal results are displayed) Labs Reviewed - No data to display  EKG None  Radiology Dg Chest Portable 1 View  Result Date: 02/02/2019 CLINICAL DATA:  Cough. Tachycardia.  COVID-19 positive. EXAM: PORTABLE CHEST 1 VIEW COMPARISON:  Radiograph 05/29/2016 FINDINGS: Low lung volumes. No focal airspace disease. Normal heart size and mediastinal contours. No pulmonary edema, pleural effusion, or pneumothorax. IMPRESSION: Low lung volumes without acute abnormality. Electronically Signed   By: Narda RutherfordMelanie  Sanford M.D.   On: 02/02/2019 23:21    Procedures Procedures (including critical care time)  Medications Ordered in ED Medications  metoCLOPramide (REGLAN) injection 10 mg (10 mg Intramuscular Given 02/02/19 2246)  diphenhydrAMINE (BENADRYL) capsule 50 mg (50 mg Oral Given 02/02/19 2245)     Initial Impression / Assessment and Plan / ED Course  I have reviewed the triage vital signs and the nursing notes.  Pertinent labs & imaging results that were available during my care of the patient were reviewed by me and considered in my medical decision making (see chart for details).        Patient seen and examined.  Patient was informed of her positive coronavirus test.  Prior to tonight she was not aware of the results.  Reviewed previous evaluation and work-up.  Will give IM Reglan and oral Benadryl.  Given patient's tachycardia will check chest x-ray.  Vital signs reviewed and are as follows: BP (!) 142/95   Pulse (!) 109   Temp 99.1 F (37.3 C) (Oral)   Resp 20    LMP 01/31/2019   SpO2 100%   Patient was able to ambulate with a fast heart rate but did not desaturate.  At rest she is down to about 110, which is where she was at urgent care recently.  In the past she has had some urgent care visits where her heart rate was normal and some where it is mildly elevated.  No history of thyroid problems.  12:56 AM prior to discharge, patient states that she is feeling somewhat better.  She still has some persistent left-sided headache.  She will be given discharge paperwork with notation of positive COVID testing.  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  We discussed her tachycardia.  We discussed that if this is persistent she will need to follow-up for this.  We discussed that since her exam is reassuring and she is in no distress and has no significant chest pain I do not feel that she needs further work-up for sepsis, pulmonary embolism, hyperthyroidism.  I encouraged her to have this rechecked.  We did discuss that if she develops a worsening headache, any kind of weakness, trouble talking or walking, persistent vomiting, vision changes she needs to return to the emergency department.  She is to return if her breathing becomes worse including worsening shortness of breath, increased work of breathing, persistent vomiting.  She verbalizes understanding agrees with plan.  Will be given a prescription for butalbital as ibuprofen has not been helping at home.  Final Clinical Impressions(s) / ED Diagnoses   Final diagnoses:  COVID-19  Bad headache  Tachycardia   Patient with respiratory infection, cough over the past 2 weeks.  Patient had a positive coronavirus test.  She seems to be improving from the standpoint per her report.  She is not hypoxic or in any respiratory distress.  She speaks in full sentences.  Her chest x-ray is clear.  No indications for admission in regards to her coronavirus.  I discussed with the patient that given this positive test, she  probably does not need to take doxycycline however she should discuss this with her doctor.  In regards to her headache, patient without high-risk features of headache including: sudden onset/thunderclap HA, no similar  headache in past, altered mental status, accompanying seizure, headache with exertion, age > 70, history of immunocompromise, fever, use of anticoagulation, family history of spontaneous SAH, concomitant drug use, toxic exposure.   Patient has a normal complete neurological exam, normal vital signs, normal level of consciousness, no signs of meningismus, is well-appearing/non-toxic appearing, no signs of trauma.  Patient does have some pain that goes down into her neck and shoulder and is worse with movement.  Again chest x-ray is clear in the apices.  She does not have any vision changes or neurological deficits which would make me concerned for a vertebral or carotid artery dissection.  Doubt venous sinus thrombosis.  Imaging with CT/MRI not indicated given history and physical exam findings.   No dangerous or life-threatening conditions suspected or identified by history, physical exam, and by work-up. No indications for hospitalization identified.   In regards to tachycardia, patient does have some visits with tachycardia in the past.  She does not have any risk factors or signs of DVT/PE on exam.  She does not have any other signs of sepsis including fever, tachypnea, pneumonia on the chest x-ray.  She appears well, nontoxic.  Discussed with Dr. Anitra Lauth.  Do not feel that patient needs further work-up for her tachycardia.  After treatment of headache and resting while waiting in the ED, her pulse rate is about 110 at time of discharge.  Discussed with patient that she should follow-up as above to have her heart rate rechecked and further evaluation as outpatient if needed.   ED Discharge Orders         Ordered    butalbital-acetaminophen-caffeine (FIORICET) 50-325-40 MG tablet   Every 6 hours PRN     02/03/19 0043           Renne Crigler, PA-C 02/03/19 0101    Gwyneth Sprout, MD 02/03/19 1609

## 2019-02-03 NOTE — Telephone Encounter (Signed)
Attempted to reach patient x2. No answer at this time. Voicemail left.    

## 2019-02-14 ENCOUNTER — Telehealth (HOSPITAL_COMMUNITY): Payer: Self-pay | Admitting: Emergency Medicine

## 2019-02-14 NOTE — Telephone Encounter (Signed)
Pt called asking for return to work post covid. Pt denies fever, has met the quarantine period. Work note sent in [facility].

## 2019-12-17 ENCOUNTER — Encounter: Payer: Self-pay | Admitting: Emergency Medicine

## 2019-12-17 ENCOUNTER — Other Ambulatory Visit: Payer: Self-pay

## 2019-12-17 ENCOUNTER — Ambulatory Visit
Admission: EM | Admit: 2019-12-17 | Discharge: 2019-12-17 | Disposition: A | Discharge status: Home / Self Care | Payer: Self-pay | Attending: Emergency Medicine | Admitting: Emergency Medicine

## 2019-12-17 DIAGNOSIS — S46911A Strain of unspecified muscle, fascia and tendon at shoulder and upper arm level, right arm, initial encounter: Secondary | ICD-10-CM

## 2019-12-17 MED ORDER — CYCLOBENZAPRINE HCL 5 MG PO TABS: 5.0000 mg | ORAL_TABLET | Freq: Two times a day (BID) | ORAL | 0 refills | Status: DC | PRN

## 2019-12-17 MED ORDER — NAPROXEN 500 MG PO TABS: 500.0000 mg | ORAL_TABLET | Freq: Two times a day (BID) | ORAL | 0 refills | Status: DC

## 2019-12-17 NOTE — ED Triage Notes (Signed)
Pt here for right shoulder and arm pain with some swelling after cleaning 2 days ago

## 2019-12-17 NOTE — ED Provider Notes (Signed)
[facility]    CSN: [id number] Arrival date & time: 12/17/19  1220      History   Chief Complaint Chief Complaint  Patient presents with  . Shoulder Pain  . Arm Pain    HPI Tara Joseph is a 29 y.o. female  Presenting for right shoulder and arm pain with mild swelling x2 days.  Patient provides history: States this was after cleaning her stove.  States she had brief episode of right arm numbness last night: Currently resolved.  Has not taken thing for this.  States she is supposed to start a new job tomorrow and is unsure if she is able to work.  Will have to perform lifting.  Past Medical History:  Diagnosis Date  . Asthma   . Migraines   . UTI (urinary tract infection)     There are no problems to display for this patient.   History reviewed. No pertinent surgical history.  OB History   No obstetric history on file.      Home Medications    Prior to Admission medications   Medication Sig Start Date End Date Taking? Authorizing Provider  albuterol (VENTOLIN HFA) 108 (90 Base) MCG/ACT inhaler Inhale 1-2 puffs into the lungs every 6 (six) hours as needed for wheezing or shortness of breath. 01/31/19   Cathie Hoops, Amy V, PA-C  butalbital-acetaminophen-caffeine (FIORICET) 50-325-40 MG tablet Take 1-2 tablets by mouth every 6 (six) hours as needed for headache. 02/03/19 02/03/20  Renne Crigler, PA-C  cyclobenzaprine (FLEXERIL) 5 MG tablet Take 1 tablet (5 mg total) by mouth 2 (two) times daily as needed for muscle spasms. 12/17/19   Hall-Potvin, [place], PA-C  naproxen (NAPROSYN) 500 MG tablet Take 1 tablet (500 mg total) by mouth 2 (two) times daily. 12/17/19   Hall-Potvin, [place], PA-C    Family History Family History  Problem Relation Age of Onset  . Hypertension Mother   . Thyroid disease Mother   . Diabetes Father   . Hypertension Brother   . Arthritis Maternal Grandmother   . Hypertension Brother     Social History Social History   Tobacco Use   . Smoking status: Never Smoker  . Smokeless tobacco: Never Used  Vaping Use  . Vaping Use: Never used  Substance Use Topics  . Alcohol use: No  . Drug use: No     Allergies   Patient has no known allergies.   Review of Systems As per HPI   Physical Exam Triage Vital Signs ED Triage Vitals  Enc Vitals Group     BP 12/17/19 1258 (!) 169/98     Pulse Rate 12/17/19 1258 94     Resp 12/17/19 1258 18     Temp 12/17/19 1258 98.3 F (36.8 C)     Temp Source 12/17/19 1258 Oral     SpO2 12/17/19 1258 99 %     Weight --      Height --      Head Circumference --      Peak Flow --      Pain Score 12/17/19 1259 8     Pain Loc --      Pain Edu? --      Excl. in GC? --    No data found.  Updated Vital Signs BP (!) 169/98 (BP Location: Left Arm)   Pulse 94   Temp 98.3 F (36.8 C) (Oral)   Resp 18   SpO2 99%   Visual Acuity Right Eye Distance:  Left Eye Distance:   Bilateral Distance:    Right Eye Near:   Left Eye Near:    Bilateral Near:     Physical Exam Constitutional:      General: She is not in acute distress. HENT:     Head: Normocephalic and atraumatic.  Eyes:     General: No scleral icterus.    Pupils: Pupils are equal, round, and reactive to light.  Cardiovascular:     Rate and Rhythm: Normal rate.  Pulmonary:     Effort: Pulmonary effort is normal.  Musculoskeletal:        General: Swelling and tenderness present. Normal range of motion.     Comments: Mild swelling of right upper trapezius.  No crepitus, mass, warmth.  Full active ROM with 5/5 strength.  NVI  Skin:    Capillary Refill: Capillary refill takes less than 2 seconds.     Coloration: Skin is not jaundiced or pale.  Neurological:     General: No focal deficit present.     Mental Status: She is alert and oriented to person, place, and time.      UC Treatments / Results  Labs (all labs ordered are listed, but only abnormal results are displayed) Labs Reviewed - No data to display   EKG   Radiology No results found.  Procedures Procedures (including critical care time)  Medications Ordered in UC Medications - No data to display  Initial Impression / Assessment and Plan / UC Course  I have reviewed the triage vital signs and the nursing notes.  Pertinent labs & imaging results that were available during my care of the patient were reviewed by me and considered in my medical decision making (see chart for details).     MSK: We will treat supportively as outlined below.  Provided contact information for orthopedics if needed.  Work note provided at patient's request.  Return precautions discussed, pt verbalized understanding and is agreeable to plan. Final Clinical Impressions(s) / UC Diagnoses   Final diagnoses:  Strain of right upper arm, initial encounter     Discharge Instructions     RICE: rest, ice, compression, elevation as needed for pain.   Heat therapy (hot compress, warm wash rag, hot showers, etc.) can help relax muscles and soothe muscle aches. Cold therapy (ice packs) can be used to help swelling both after injury and after prolonged use of areas of chronic pain/aches.  Pain medication:  500 mg Naprosyn/Aleve (naproxen) every 12 hours with food:  AVOID other NSAIDs while taking this (may have Tylenol).  May take muscle relaxer as needed for severe pain / spasm.  (This medication may cause you to become tired so it is important you do not drink alcohol or operate heavy machinery while on this medication.  Recommend your first dose to be taken before bedtime to monitor for side effects safely)  Important to follow up with specialist(s) below for further evaluation/management if your symptoms persist or worsen.    ED Prescriptions    Medication Sig Dispense Auth. Provider   cyclobenzaprine (FLEXERIL) 5 MG tablet Take 1 tablet (5 mg total) by mouth 2 (two) times daily as needed for muscle spasms. 10 tablet Hall-Potvin, [place], PA-C    naproxen (NAPROSYN) 500 MG tablet Take 1 tablet (500 mg total) by mouth 2 (two) times daily. 30 tablet Hall-Potvin, [place], PA-C     PDMP not reviewed this encounter.   Hall-Potvin, [place], [place] 12/17/19 1401

## 2019-12-17 NOTE — Discharge Instructions (Signed)

## 2020-01-11 ENCOUNTER — Encounter: Payer: Self-pay | Admitting: Nurse Practitioner

## 2020-01-11 ENCOUNTER — Other Ambulatory Visit: Payer: Self-pay

## 2020-01-11 ENCOUNTER — Ambulatory Visit (INDEPENDENT_AMBULATORY_CARE_PROVIDER_SITE_OTHER): Payer: Self-pay | Admitting: Nurse Practitioner

## 2020-01-11 VITALS — BP 126/82 | HR 90 | Temp 97.9°F | Ht 60.0 in | Wt 191.6 lb

## 2020-01-11 DIAGNOSIS — J4521 Mild intermittent asthma with (acute) exacerbation: Secondary | ICD-10-CM

## 2020-01-11 DIAGNOSIS — J45909 Unspecified asthma, uncomplicated: Secondary | ICD-10-CM | POA: Insufficient documentation

## 2020-01-11 DIAGNOSIS — Z23 Encounter for immunization: Secondary | ICD-10-CM

## 2020-01-11 MED ORDER — ALBUTEROL SULFATE HFA 108 (90 BASE) MCG/ACT IN AERS: 1.0000 | INHALATION_SPRAY | Freq: Four times a day (QID) | RESPIRATORY_TRACT | 0 refills | Status: DC | PRN

## 2020-01-11 MED ORDER — CETIRIZINE HCL 10 MG PO TABS: 10.0000 mg | ORAL_TABLET | Freq: Every day | ORAL | 2 refills | Status: DC

## 2020-01-11 MED ORDER — BECLOMETHASONE DIPROPIONATE 40 MCG/ACT IN AERS: 1.0000 | INHALATION_SPRAY | Freq: Two times a day (BID) | RESPIRATORY_TRACT | 2 refills | Status: DC

## 2020-01-11 NOTE — Progress Notes (Signed)
Subjective:  Patient ID: Tara Joseph, female    DOB: August 28, 1990  Age: 29 y.o. MRN: [id number]  CC: Acute Visit (Issues with asthma since starting new job x2 weeks. Worse at night and first thing in the morning, there is a lot of smoke at the job/ Right hand swollen x1 day and wants to know if this could be due to cholesterol. )  Asthma She complains of chest tightness and frequent throat clearing. There is no hemoptysis, hoarse voice, shortness of breath, sputum production or wheezing. This is a chronic problem. Episode onset: worse in last 2weeks. The problem occurs constantly. The problem has been unchanged. Associated symptoms include postnasal drip. Pertinent negatives include no appetite change, chest pain, dyspnea on exertion, malaise/fatigue, myalgias, nasal congestion, PND, rhinorrhea, sore throat, sweats, trouble swallowing or weight loss. Her symptoms are aggravated by exposure to fumes and exposure to smoke. Her symptoms are alleviated by beta-agonist. She reports moderate improvement on treatment. Her past medical history is significant for asthma.   Reviewed past Medical, Social and Family history today.  Outpatient Medications Prior to Visit  Medication Sig Dispense Refill   butalbital-acetaminophen-caffeine (FIORICET) 50-325-40 MG tablet Take 1-2 tablets by mouth every 6 (six) hours as needed for headache. 20 tablet 0   cyclobenzaprine (FLEXERIL) 5 MG tablet Take 1 tablet (5 mg total) by mouth 2 (two) times daily as needed for muscle spasms. 10 tablet 0   naproxen (NAPROSYN) 500 MG tablet Take 1 tablet (500 mg total) by mouth 2 (two) times daily. 30 tablet 0   albuterol (VENTOLIN HFA) 108 (90 Base) MCG/ACT inhaler Inhale 1-2 puffs into the lungs every 6 (six) hours as needed for wheezing or shortness of breath. (Patient not taking: Reported on 01/11/2020) 8 g 0   No facility-administered medications prior to visit.    ROS See HPI  Objective:  Temp 97.9 F (36.6 C)  (Temporal)    Ht 5' (1.524 m)    Wt 191 lb 9.6 oz (86.9 kg)    BMI 37.42 kg/m   Physical Exam Vitals reviewed.  Constitutional:      Appearance: She is obese.  Cardiovascular:     Rate and Rhythm: Normal rate and regular rhythm.     Pulses: Normal pulses.     Heart sounds: Normal heart sounds.  Pulmonary:     Effort: Pulmonary effort is normal.     Breath sounds: Normal breath sounds.  Musculoskeletal:        General: No swelling, tenderness or deformity.     Right lower leg: No edema.     Left lower leg: No edema.  Neurological:     Mental Status: She is alert and oriented to person, place, and time.     Assessment & Plan:  This visit occurred during the SARS-CoV-2 public health emergency.  Safety protocols were in place, including screening questions prior to the visit, additional usage of staff PPE, and extensive cleaning of exam room while observing appropriate contact time as indicated for disinfecting solutions.   Tara Joseph was seen today for acute visit.  Diagnoses and all orders for this visit:  Mild intermittent asthma with acute exacerbation -     albuterol (VENTOLIN HFA) 108 (90 Base) MCG/ACT inhaler; Inhale 1-2 puffs into the lungs every 6 (six) hours as needed for wheezing or shortness of breath. -     beclomethasone (QVAR) 40 MCG/ACT inhaler; Inhale 1 puff into the lungs 2 (two) times daily. Rinse mouth after each use -  cetirizine (ZYRTEC) 10 MG tablet; Take 1 tablet (10 mg total) by mouth at bedtime.   Problem List Items Addressed This Visit      Respiratory   Mild intermittent asthma with acute exacerbation - Primary   Relevant Medications   albuterol (VENTOLIN HFA) 108 (90 Base) MCG/ACT inhaler   beclomethasone (QVAR) 40 MCG/ACT inhaler   cetirizine (ZYRTEC) 10 MG tablet      Follow-up: Return in about 2 weeks (around 01/25/2020) for CPE (fasting).  Alysia Penna, NP

## 2020-01-11 NOTE — Assessment & Plan Note (Signed)
Acute on chronic due to exposure to fumes at work.  Start antihistamine and ICS daily x30days. Continue albuterol prn Advised about use of mask and hair net at work, ensure showering before bed and frequent change of bed covering to minimize exposure to allergens.

## 2020-01-11 NOTE — Patient Instructions (Signed)
Asthma Attack Prevention, Adult Although you may not be able to control the fact that you have asthma, you can take actions to prevent episodes of asthma (asthma attacks). These actions include:  Creating a written plan for managing and treating your asthma attacks (asthma action plan).  Monitoring your asthma.  Avoiding things that can irritate your airways or make your asthma symptoms worse (asthma triggers).  Taking your medicines as directed.  Acting quickly if you have signs or symptoms of an asthma attack. What are some ways to prevent an asthma attack? Create a plan Work with your health care provider to create an asthma action plan. This plan should include:  A list of your asthma triggers and how to avoid them.  A list of symptoms that you experience during an asthma attack.  Information about when to take medicine and how much medicine to take.  Information to help you understand your peak flow measurements.  Contact information for your health care providers.  Daily actions that you can take to control asthma. Monitor your asthma To monitor your asthma:  Use your peak flow meter every morning and every evening for 2-3 weeks. Record the results in a journal. A drop in your peak flow numbers on one or more days may mean that you are starting to have an asthma attack, even if you are not having symptoms.  When you have asthma symptoms, write them down in a journal.  Avoid asthma triggers Work with your health care provider to find out what your asthma triggers are. This can be done by:  Being tested for allergies.  Keeping a journal that notes when asthma attacks occur and what may have contributed to them.  Asking your health care provider whether other medical conditions make your asthma worse. Common asthma triggers include:  Dust.  Smoke. This includes campfire smoke and secondhand smoke from tobacco products.  Pet dander.  Trees, grasses or  pollens.  Very cold, dry, or humid air.  Mold.  Foods that contain high amounts of sulfites.  Strong smells.  Engine exhaust and air pollution.  Aerosol sprays and fumes from household cleaners.  Household pests and their droppings, including dust mites and cockroaches.  Certain medicines, including NSAIDs. Once you have determined your asthma triggers, take steps to avoid them. Depending on your triggers, you may be able to reduce the chance of an asthma attack by:  Keeping your home clean. Have someone dust and vacuum your home for you 1 or 2 times a week. If possible, have them use a high-efficiency particulate arrestance (HEPA) vacuum.  Washing your sheets weekly in hot water.  Using allergy-proof mattress covers and casings on your bed.  Keeping pets out of your home.  Taking care of mold and water problems in your home.  Avoiding areas where people smoke.  Avoiding using strong perfumes or odor sprays.  Avoid spending a lot of time outdoors when pollen counts are high and on very windy days.  Talking with your health care provider before stopping or starting any new medicines. Medicines Take over-the-counter and prescription medicines only as told by your health care provider. Many asthma attacks can be prevented by carefully following your medicine schedule. Taking your medicines correctly is especially important when you cannot avoid certain asthma triggers. Even if you are doing well, do not stop taking your medicine and do not take less medicine. Act quickly If an asthma attack happens, acting quickly can decrease how severe it is and   how long it lasts. Take these actions:  Pay attention to your symptoms. If you are coughing, wheezing, or having difficulty breathing, do not wait to see if your symptoms go away on their own. Follow your asthma action plan.  If you have followed your asthma action plan and your symptoms are not improving, call your health care  provider or seek immediate medical care at the nearest hospital. It is important to write down how often you need to use your fast-acting rescue inhaler. You can track how often you use an inhaler in your journal. If you are using your rescue inhaler more often, it may mean that your asthma is not under control. Adjusting your asthma treatment plan may help you to prevent future asthma attacks and help you to gain better control of your condition. How can I prevent an asthma attack when I exercise? Exercise is a common asthma trigger. To prevent asthma attacks during exercise:  Follow advice from your health care provider about whether you should use your fast-acting inhaler before exercising. Many people with asthma experience exercise-induced bronchoconstriction (EIB). This condition often worsens during vigorous exercise in cold, humid, or dry environments. Usually, people with EIB can stay very active by using a fast-acting inhaler before exercising.  Avoid exercising outdoors in very cold or humid weather.  Avoid exercising outdoors when pollen counts are high.  Warm up and cool down when exercising.  Stop exercising right away if asthma symptoms start. Consider taking part in exercises that are less likely to cause asthma symptoms such as:  Indoor swimming.  Biking.  Walking.  Hiking.  Playing football. This information is not intended to replace advice given to you by your health care provider. Make sure you discuss any questions you have with your health care provider. Document Revised: 04/02/2017 Document Reviewed: 10/05/2015 Elsevier Patient Education  2020 [employer]  High Triglycerides Eating Plan Triglycerides are a type of fat in the blood. High levels of triglycerides can increase your risk of heart disease and stroke. If your triglyceride levels are high, choosing the right foods can help lower your triglycerides and keep your heart healthy. Work with your health  care provider or a diet and nutrition specialist (dietitian) to develop an eating plan that is right for you. What are tips for following this plan? General guidelines   Lose weight, if you are overweight. For most people, losing 5-10 lbs (2-5 kg) helps lower triglyceride levels. A weight-loss plan may include. ? 30 minutes of exercise at least 5 days a week. ? Reducing the amount of calories, sugar, and fat you eat.  Eat a wide variety of fresh fruits, vegetables, and whole grains. These foods are high in fiber.  Eat foods that contain healthy fats, such as fatty fish, nuts, seeds, and olive oil.  Avoid foods that are high in added sugar, added salt (sodium), saturated fat, and trans fat.  Avoid low-fiber, refined carbohydrates such as white bread, crackers, noodles, and white rice.  Avoid foods with partially hydrogenated oils (trans fats), such as fried foods or stick margarine.  Limit alcohol intake to no more than 1 drink a day for nonpregnant women and 2 drinks a day for men. One drink equals 12 oz of beer, 5 oz of wine, or 1 oz of hard liquor. Your health care provider may recommend that you drink less depending on your overall health. Reading food labels  Check food labels for the amount of saturated fat. Choose foods with no  or very little saturated fat.  Check food labels for the amount of trans fat. Choose foods with no trans fat.  Check food labels for the amount of cholesterol. Choose foods low in cholesterol. Ask your dietitian how much cholesterol you should have each day.  Check food labels for the amount of sodium. Choose foods with less than 140 milligrams (mg) per serving. Shopping  Buy dairy products labeled as nonfat (skim) or low-fat (1%).  Avoid buying processed or prepackaged foods. These are often high in added sugar, sodium, and fat. Cooking  Choose healthy fats when cooking, such as olive oil or canola oil.  Cook foods using lower fat methods, such as  baking, broiling, boiling, or grilling.  Make your own sauces, dressings, and marinades when possible, instead of buying them. Store-bought sauces, dressings, and marinades are often high in sodium and sugar. Meal planning  Eat more home-cooked food and less restaurant, buffet, and fast food.  Eat fatty fish at least 2 times each week. Examples of fatty fish include salmon, trout, mackerel, tuna, and herring.  If you eat whole eggs, do not eat more than 3 egg yolks per week. What foods are recommended? The items listed may not be a complete list. Talk with your dietitian about what dietary choices are best for you. Grains Whole wheat or whole grain breads, crackers, cereals, and pasta. Unsweetened oatmeal. Bulgur. Barley. Quinoa. Brown rice. Whole wheat flour tortillas. Vegetables Fresh or frozen vegetables. Low-sodium canned vegetables. Fruits All fresh, canned (in natural juice), or frozen fruits. Meats and other protein foods Skinless chicken or [place]. Ground chicken or [place]. Lean cuts of pork, trimmed of fat. Fish and seafood, especially salmon, trout, and herring. Egg whites. Dried beans, peas, or lentils. Unsalted nuts or seeds. Unsalted canned beans. Natural peanut or almond butter. Dairy Low-fat dairy products. Skim or low-fat (1%) milk. Reduced fat (2%) and low-sodium cheese. Low-fat ricotta cheese. Low-fat cottage cheese. Plain, low-fat yogurt. Fats and oils Tub margarine without trans fats. Light or reduced-fat mayonnaise. Light or reduced-fat salad dressings. Avocado. Safflower, olive, sunflower, soybean, and canola oils. What foods are not recommended? The items listed may not be a complete list. Talk with your dietitian about what dietary choices are best for you. Grains White bread. White (regular) pasta. White rice. Cornbread. Bagels. Pastries. Crackers that contain trans fat. Vegetables Creamed or fried vegetables. Vegetables in a cheese sauce. Fruits Sweetened  dried fruit. Canned fruit in syrup. Fruit juice. Meats and other protein foods Fatty cuts of meat. Ribs. Chicken wings. Tomasa Blase. Sausage. Bologna. Salami. Chitterlings. Fatback. Hot dogs. Bratwurst. Packaged lunch meats. Dairy Whole or reduced-fat (2%) milk. Half-and-half. Cream cheese. Full-fat or sweetened yogurt. Full-fat cheese. Nondairy creamers. Whipped toppings. Processed cheese or cheese spreads. Cheese curds. Beverages Alcohol. Sweetened drinks, such as soda, lemonade, fruit drinks, or punches. Fats and oils Butter. Stick margarine. Lard. Shortening. Ghee. Bacon fat. Tropical oils, such as coconut, palm kernel, or palm oils. Sweets and desserts Corn syrup. Sugars. Honey. Molasses. Candy. Jam and jelly. Syrup. Sweetened cereals. Cookies. Pies. Cakes. Donuts. Muffins. Ice cream. Condiments Store-bought sauces, dressings, and marinades that are high in sugar, such as ketchup and barbecue sauce. Summary  High levels of triglycerides can increase the risk of heart disease and stroke. Choosing the right foods can help lower your triglycerides.  Eat plenty of fresh fruits, vegetables, and whole grains. Choose low-fat dairy and lean meats. Eat fatty fish at least twice a week.  Avoid processed and prepackaged foods  with added sugar, sodium, saturated fat, and trans fat.  If you need suggestions or have questions about what types of food are good for you, talk with your health care provider or a dietitian. This information is not intended to replace advice given to you by your health care provider. Make sure you discuss any questions you have with your health care provider. Document Revised: 04/02/2017 Document Reviewed: 06/23/2016 Elsevier Patient Education  2020 [employer].

## 2020-02-02 ENCOUNTER — Other Ambulatory Visit: Payer: Self-pay | Admitting: Nurse Practitioner

## 2020-02-02 DIAGNOSIS — J4521 Mild intermittent asthma with (acute) exacerbation: Secondary | ICD-10-CM

## 2020-02-14 ENCOUNTER — Encounter: Payer: Self-pay | Admitting: Nurse Practitioner

## 2020-02-14 ENCOUNTER — Ambulatory Visit (INDEPENDENT_AMBULATORY_CARE_PROVIDER_SITE_OTHER): Payer: Self-pay | Admitting: Nurse Practitioner

## 2020-02-14 ENCOUNTER — Other Ambulatory Visit: Payer: Self-pay

## 2020-02-14 VITALS — BP 124/80 | HR 92 | Temp 98.0°F | Ht 60.25 in | Wt 184.8 lb

## 2020-02-14 DIAGNOSIS — Z0000 Encounter for general adult medical examination without abnormal findings: Secondary | ICD-10-CM

## 2020-02-14 DIAGNOSIS — J4521 Mild intermittent asthma with (acute) exacerbation: Secondary | ICD-10-CM

## 2020-02-14 DIAGNOSIS — Z0001 Encounter for general adult medical examination with abnormal findings: Secondary | ICD-10-CM

## 2020-02-14 DIAGNOSIS — Z13220 Encounter for screening for lipoid disorders: Secondary | ICD-10-CM

## 2020-02-14 DIAGNOSIS — Z6835 Body mass index (BMI) 35.0-35.9, adult: Secondary | ICD-10-CM

## 2020-02-14 DIAGNOSIS — E559 Vitamin D deficiency, unspecified: Secondary | ICD-10-CM

## 2020-02-14 DIAGNOSIS — Z23 Encounter for immunization: Secondary | ICD-10-CM

## 2020-02-14 DIAGNOSIS — Z136 Encounter for screening for cardiovascular disorders: Secondary | ICD-10-CM

## 2020-02-14 DIAGNOSIS — L83 Acanthosis nigricans: Secondary | ICD-10-CM

## 2020-02-14 LAB — TSH: TSH: 3.9 u[IU]/mL (ref 0.35–4.50)

## 2020-02-14 LAB — CBC WITH DIFFERENTIAL/PLATELET
Basophils Absolute: 0.1 10*3/uL (ref 0.0–0.1)
Basophils Relative: 1.5 % (ref 0.0–3.0)
Eosinophils Absolute: 0.2 10*3/uL (ref 0.0–0.7)
Eosinophils Relative: 3.2 % (ref 0.0–5.0)
HCT: 37.9 % (ref 36.0–46.0)
Hemoglobin: 12.3 g/dL (ref 12.0–15.0)
Lymphocytes Relative: 34.5 % (ref 12.0–46.0)
Lymphs Abs: 2.1 10*3/uL (ref 0.7–4.0)
MCHC: 32.4 g/dL (ref 30.0–36.0)
MCV: 79.6 fl (ref 78.0–100.0)
Monocytes Absolute: 0.7 10*3/uL (ref 0.1–1.0)
Monocytes Relative: 11.4 % (ref 3.0–12.0)
Neutro Abs: 3 10*3/uL (ref 1.4–7.7)
Neutrophils Relative %: 49.4 % (ref 43.0–77.0)
Platelets: 387 10*3/uL (ref 150.0–400.0)
RBC: 4.76 Mil/uL (ref 3.87–5.11)
RDW: 16.1 % — ABNORMAL HIGH (ref 11.5–15.5)
WBC: 6 10*3/uL (ref 4.0–10.5)

## 2020-02-14 LAB — COMPREHENSIVE METABOLIC PANEL
ALT: 15 U/L (ref 0–35)
AST: 14 U/L (ref 0–37)
Albumin: 4.4 g/dL (ref 3.5–5.2)
Alkaline Phosphatase: 73 U/L (ref 39–117)
BUN: 15 mg/dL (ref 6–23)
CO2: 25 mEq/L (ref 19–32)
Calcium: 9.4 mg/dL (ref 8.4–10.5)
Chloride: 104 mEq/L (ref 96–112)
Creatinine, Ser: 0.64 mg/dL (ref 0.40–1.20)
GFR: 122.01 mL/min (ref >60.00)
Glucose, Bld: 90 mg/dL (ref 70–99)
Potassium: 4.1 mEq/L (ref 3.5–5.1)
Sodium: 137 mEq/L (ref 135–145)
Total Bilirubin: 0.5 mg/dL (ref 0.2–1.2)
Total Protein: 7 g/dL (ref 6.0–8.3)

## 2020-02-14 LAB — LIPID PANEL
Cholesterol: 196 mg/dL (ref 0–200)
HDL: 45.3 mg/dL (ref >39.00)
LDL Cholesterol: 126 mg/dL — ABNORMAL HIGH (ref 0–99)
NonHDL: 150.49
Total CHOL/HDL Ratio: 4
Triglycerides: 121 mg/dL (ref 0.0–149.0)
VLDL: 24.2 mg/dL (ref 0.0–40.0)

## 2020-02-14 LAB — HEMOGLOBIN A1C: Hgb A1c MFr Bld: 5.8 % (ref 4.6–6.5)

## 2020-02-14 MED ORDER — BECLOMETHASONE DIPROPIONATE 40 MCG/ACT IN AERS: 2.0000 | INHALATION_SPRAY | Freq: Two times a day (BID) | RESPIRATORY_TRACT | 2 refills | Status: DC

## 2020-02-14 NOTE — Progress Notes (Signed)
Subjective:    Patient ID: Tara Joseph, female    DOB: 1990-06-30, 29 y.o.   MRN: [id number]  Patient presents today for CPE and eval of chronic conditions  HPI Mild intermittent asthma with acute exacerbation Persistent cough and intermittent wheezing Some improvement with albuterol and antihistamine Does not use qvar daily.  Increase Qvar to 2puffs BID Up in 58month  Class 2 severe obesity due to excess calories with serious comorbidity and body mass index (BMI) of 35.0 to 35.9 in adult [facility](HCC) Acanthosis nigrican on neck and axilla region. Add her about correlation to insulin resistance, cancer and heart disease. Advised about importance of low carb/low fat diet and regular exercise. Provided printed material. Check HgbA1c, lipid panel and TSH Unable to refer to nutritionist due to lack of medical insurance.   Sexual History (orientation,birth control, marital status, STD):never been sexually active, agreed to HPV vaccine today  Dep[facility]Suicide[facility] 01/31/2018  Decreased Interest 0 0  Down, Depressed, Hopeless 0 0  PHQ - 2 Score 0 0   Vision:will schedule  Dental:will schedule  Immunizations: (TDAP, Hep C screen, Pneumovax, Influenza, zoster)  Health Maintenance  Topic Date Due   COVID-19 Vaccine (1) Never done   Pap Smear  Never done   Pap Smear  02/13/2021*    Hepatitis C: One time screening is recommended by Center for Disease Control  (CDC) for  adults born from [birth year] through 1963.   02/13/2021*   HIV Screening  02/13/2021*   Tetanus Vaccine  12/14/2027   Flu Shot  Completed  *Topic was postponed. The date shown is not the original due date.   Diet:regular  Weight:  Wt Readings from Last 3 Encounters:  02/14/20 184 lb 12.8 oz (83.8 kg)  01/11/20 191 lb 9.6 oz (86.9 kg)  01/31/18 180 lb (81.6 kg)   Fall Risk: Fall Risk  01/31/2018  Falls in the past year? No   Medications and allergies reviewed with patient  and updated if appropriate.  Patient Active Problem List   Diagnosis Date Noted   Class 2 severe obesity due to excess calories with serious comorbidity and body mass index (BMI) of 35.0 to 35.9 in adult [facility](HCC) 02/16/2020   Acanthosis nigricans 02/16/2020   Mild intermittent asthma with acute exacerbation 01/11/2020    Current Outpatient Medications on File Prior to Visit  Medication Sig Dispense Refill   albuterol (VENTOLIN HFA) 108 (90 Base) MCG/ACT inhaler Inhale 1-2 puffs into the lungs every 6 (six) hours as needed for wheezing or shortness of breath. 8 g 0   cetirizine (ZYRTEC) 10 MG tablet Take 1 tablet (10 mg total) by mouth at bedtime. 30 tablet 2   cyclobenzaprine (FLEXERIL) 5 MG tablet Take 1 tablet (5 mg total) by mouth 2 (two) times daily as needed for muscle spasms. 10 tablet 0   naproxen (NAPROSYN) 500 MG tablet Take 1 tablet (500 mg total) by mouth 2 (two) times daily. 30 tablet 0   No current facility-administered medications on file prior to visit.    Past Medical History:  Diagnosis Date   Asthma    Migraines    UTI (urinary tract infection)     History reviewed. No pertinent surgical history.  Social History   Socioeconomic History   Marital status: Single    Spouse name: Not on file   Number of children: 0   Years of education: Not on file   Highest education level: Not on file  Occupational History  Not on file  Tobacco Use   Smoking status: Never Smoker   Smokeless tobacco: Never Used  Vaping Use   Vaping Use: Never used  Substance and Sexual Activity   Alcohol use: No   Drug use: No   Sexual activity: Never    Birth control/protection: Abstinence  Other Topics Concern   Not on file  Social History Narrative   Not on file   Social Determinants of Health   Financial Resource Strain:    Difficulty of Paying Living Expenses: Not on file  Food Insecurity:    Worried About Programme researcher, broadcasting/film/video in the Last Year: Not on  file   [employer] of Food in the Last Year: Not on file  Transportation Needs:    Lack of Transportation (Medical): Not on file   Lack of Transportation (Non-Medical): Not on file  Physical Activity:    Days of Exercise per Week: Not on file   Minutes of Exercise per Session: Not on file  Stress:    Feeling of Stress : Not on file  Social Connections:    Frequency of Communication with Friends and Family: Not on file   Frequency of Social Gatherings with Friends and Family: Not on file   Attends Religious Services: Not on file   Active Member of Clubs or Organizations: Not on file   Attends Banker Meetings: Not on file   Marital Status: Not on file    Family History  Problem Relation Age of Onset   Hypertension Mother    Thyroid disease Mother    Diabetes Father    Hypertension Brother    Arthritis Maternal Grandmother    Hypertension Brother         Review of Systems  Constitutional: Negative for fever, malaise/fatigue and weight loss.  HENT: Negative for congestion and sore throat.   Eyes:       Negative for visual changes  Respiratory: Positive for cough and wheezing. Negative for shortness of breath.   Cardiovascular: Negative for chest pain, palpitations and leg swelling.  Gastrointestinal: Negative for blood in stool, constipation, diarrhea and heartburn.  Genitourinary: Negative for dysuria, frequency and urgency.  Musculoskeletal: Negative for falls, joint pain and myalgias.  Skin: Negative for rash.  Neurological: Negative for dizziness, sensory change and headaches.  Endo/Heme/Allergies: Does not bruise/bleed easily.  Psychiatric/Behavioral: Negative for depression, substance abuse and suicidal ideas. The patient is not nervous/anxious.     Objective:   Vitals:   02/14/20 0821  BP: 124/80  Pulse: 92  Temp: 98 F (36.7 C)  SpO2: 98%    Body mass index is 35.79 kg/m.   Physical Examination:  Physical Exam Vitals  reviewed.  Constitutional:      General: She is not in acute distress.    Appearance: She is well-developed. She is obese.  HENT:     Right Ear: External ear normal.     Left Ear: External ear normal.     Nose: Nose normal.     Mouth/Throat:     Pharynx: No oropharyngeal exudate.  Eyes:     Conjunctiva/sclera: Conjunctivae normal.     Pupils: Pupils are equal, round, and reactive to light.  Cardiovascular:     Rate and Rhythm: Normal rate and regular rhythm.     Heart sounds: Normal heart sounds.  Pulmonary:     Effort: Pulmonary effort is normal. No respiratory distress.     Breath sounds: Normal breath sounds.  Chest:  Chest wall: No tenderness.     Breasts:        Right: Normal.        Left: Normal.  Genitourinary:    Comments: Declined pelvic exam. Never been sexually active Musculoskeletal:        General: Normal range of motion.  Lymphadenopathy:     Upper Body:     Right upper body: No supraclavicular, axillary or pectoral adenopathy.     Left upper body: No supraclavicular, axillary or pectoral adenopathy.  Neurological:     Mental Status: She is alert and oriented to person, place, and time.     Deep Tendon Reflexes: Reflexes are normal and symmetric.    ASSESSMENT and PLAN: This visit occurred during the SARS-CoV-2 public health emergency.  Safety protocols were in place, including screening questions prior to the visit, additional usage of staff PPE, and extensive cleaning of exam room while observing appropriate contact time as indicated for disinfecting solutions.   Mersadie was seen today for annual exam.  Diagnoses and all orders for this visit:  Encounter for preventative adult health care exam with abnormal findings -     CBC with Differential/Platelet -     Comprehensive metabolic panel -     TSH -     HPV 9-valent vaccine,Recombinat  Encounter for lipid screening for cardiovascular disease  Acanthosis nigricans -     Lipid panel -      Hemoglobin A1c -     Vitamin D 1,25 dihydroxy  Class 2 severe obesity due to excess calories with serious comorbidity and body mass index (BMI) of 35.0 to 35.9 in adult (HCC) -     Lipid panel -     Hemoglobin A1c -     Vitamin D 1,25 dihydroxy  Vitamin D insufficiency -     Vitamin D 1,25 dihydroxy  Mild intermittent asthma with acute exacerbation -     beclomethasone (QVAR) 40 MCG/ACT inhaler; Inhale 2 puffs into the lungs 2 (two) times daily. Rinse mouth after each use      Problem List Items Addressed This Visit      Respiratory   Mild intermittent asthma with acute exacerbation    Persistent cough and intermittent wheezing Some improvement with albuterol and antihistamine Does not use qvar daily.  Increase Qvar to 2puffs BID Up in 29month      Relevant Medications   beclomethasone (QVAR) 40 MCG/ACT inhaler     Musculoskeletal and Integument   Acanthosis nigricans   Relevant Orders   Lipid panel (Completed)   Hemoglobin A1c (Completed)   Vitamin D 1,25 dihydroxy     Other   Class 2 severe obesity due to excess calories with serious comorbidity and body mass index (BMI) of 35.0 to 35.9 in adult [facility])    Acanthosis nigrican on neck and axilla region. Add her about correlation to insulin resistance, cancer and heart disease. Advised about importance of low carb/low fat diet and regular exercise. Provided printed material. Check HgbA1c, lipid panel and TSH Unable to refer to nutritionist due to lack of medical insurance.      Relevant Orders   Lipid panel (Completed)   Hemoglobin A1c (Completed)   Vitamin D 1,25 dihydroxy    Other Visit Diagnoses    Encounter for preventative adult health care exam with abnormal findings    -  Primary   Relevant Orders   CBC with Differential/Platelet (Completed)   Comprehensive metabolic panel (Completed)   TSH (Completed)  HPV 9-valent vaccine,Recombinat (Completed)   Encounter for lipid screening for cardiovascular  disease       Vitamin D insufficiency       Relevant Orders   Vitamin D 1,25 dihydroxy      Follow up: Return in about 4 weeks (around 03/13/2020) for asthma (video).  Alysia Penna, NP

## 2020-02-14 NOTE — Patient Instructions (Signed)
Go to lab for blood draw Increase qvar dose to 2puffs BID, rinse mouth after each use. Maintain mediterraenean diet and daily exercise  Acanthosis Nigricans Acanthosis nigricans is a condition in which dark, velvety markings appear on the skin. What are the causes? This condition may be caused by:  A hormonal or glandular disorder, such as diabetes.  Obesity.  Certain medicines, such as birth control pills.  A tumor. This is rare. Some people inherit the condition from their parents. What increases the risk? You are more likely to develop this condition if you:  Have a hormonal or glandular disorder.  Are overweight.  Take certain medicines.  Have certain cancers, especially stomach cancer.  Have dark-colored skin (dark complexion). What are the signs or symptoms? The main symptom of this condition is velvety markings on the skin that are light brown, black, or grayish in color.  The markings usually appear on the face. They may also appear in skin fold areas at the neck, armpits, inner thighs, and groin.  In severe cases, markings may also appear on the lips, hands, breasts, eyelids, and mouth. How is this diagnosed? This condition may be diagnosed based on your symptoms.  A skin sample may be removed for testing (skin biopsy).  You may also have tests to help determine the cause of the condition. How is this treated? Treatment for this condition depends on the cause. Treatment may involve reducing insulin levels, which are often high in people who have this condition. Insulin levels can be reduced with:  Dietary changes, such as avoiding starchy foods and sugars.  Losing weight.  Medicines. Sometimes, treatment involves:  Medicines to improve the appearance of the skin.  Laser treatment to improve the appearance of the skin.  Surgical removal of the skin markings (dermabrasion). Follow these instructions at home:  Follow diet instructions from your health  care provider.  Lose weight if you are overweight.  Take over-the-counter and prescription medicines only as told by your health care provider.  Keep all follow-up visits as told by your health care provider. This is important. Contact a health care provider if:  New skin markings develop on a part of the body where they rarely develop, such as on your lips, hands, breasts, eyelids, or mouth.  The condition recurs for an unknown reason. Summary  Acanthosis nigricans is a condition in which dark, velvety markings appear on the skin.  Treatment for this condition depends on the cause. Treatment may include dietary changes, medicines, laser treatment, or surgery.  Take over-the-counter and prescription medicines only as told by your health care provider.  Contact a health care provider if new skin markings develop on a part of the body where they rarely develop, such as on your lips, hands, breasts, eyelids, or mouth.  Keep all follow-up visits as told by your health care provider. This is important. This information is not intended to replace advice given to you by your health care provider. Make sure you discuss any questions you have with your health care provider. Document Revised: 08/30/2017 Document Reviewed: 08/30/2017 Elsevier Patient Education  2020 [employer].   Mediterranean Diet A Mediterranean diet refers to food and lifestyle choices that are based on the traditions of countries located on the [employer] [place]. This way of eating has been shown to help prevent certain conditions and improve outcomes for people who have chronic diseases, like kidney disease and heart disease. What are tips for following this plan? Lifestyle  Cook and eat  meals together with your family, when possible.  Drink enough fluid to keep your urine clear or pale yellow.  Be physically active every day. This includes: ? Aerobic exercise like running or swimming. ? Leisure activities like  gardening, walking, or housework.  Get 7-8 hours of sleep each night.  If recommended by your health care provider, drink red wine in moderation. This means 1 glass a day for nonpregnant women and 2 glasses a day for men. A glass of wine equals 5 oz (150 mL). Reading food labels   Check the serving size of packaged foods. For foods such as rice and pasta, the serving size refers to the amount of cooked product, not dry.  Check the total fat in packaged foods. Avoid foods that have saturated fat or trans fats.  Check the ingredients list for added sugars, such as corn syrup. Shopping  At the grocery store, buy most of your food from the areas near the walls of the store. This includes: ? Fresh fruits and vegetables (produce). ? Grains, beans, nuts, and seeds. Some of these may be available in unpackaged forms or large amounts (in bulk). ? Fresh seafood. ? Poultry and eggs. ? Low-fat dairy products.  Buy whole ingredients instead of prepackaged foods.  Buy fresh fruits and vegetables in-season from local farmers markets.  Buy frozen fruits and vegetables in resealable bags.  If you do not have access to quality fresh seafood, buy precooked frozen shrimp or canned fish, such as tuna, salmon, or sardines.  Buy small amounts of raw or cooked vegetables, salads, or olives from the deli or salad bar at your store.  Stock your pantry so you always have certain foods on hand, such as olive oil, canned tuna, canned tomatoes, rice, pasta, and beans. Cooking  Cook foods with extra-virgin olive oil instead of using butter or other vegetable oils.  Have meat as a side dish, and have vegetables or grains as your main dish. This means having meat in small portions or adding small amounts of meat to foods like pasta or stew.  Use beans or vegetables instead of meat in common dishes like chili or lasagna.  Experiment with different cooking methods. Try roasting or broiling vegetables instead  of steaming or sauteing them.  Add frozen vegetables to soups, stews, pasta, or rice.  Add nuts or seeds for added healthy fat at each meal. You can add these to yogurt, salads, or vegetable dishes.  Marinate fish or vegetables using olive oil, lemon juice, garlic, and fresh herbs. Meal planning   Plan to eat 1 vegetarian meal one day each week. Try to work up to 2 vegetarian meals, if possible.  Eat seafood 2 or more times a week.  Have healthy snacks readily available, such as: ? Vegetable sticks with hummus. ? [place] yogurt. ? Fruit and nut trail mix.  Eat balanced meals throughout the week. This includes: ? Fruit: 2-3 servings a day ? Vegetables: 4-5 servings a day ? Low-fat dairy: 2 servings a day ? Fish, poultry, or lean meat: 1 serving a day ? Beans and legumes: 2 or more servings a week ? Nuts and seeds: 1-2 servings a day ? Whole grains: 6-8 servings a day ? Extra-virgin olive oil: 3-4 servings a day  Limit red meat and sweets to only a few servings a month What are my food choices?  Mediterranean diet ? Recommended  Grains: Whole-grain pasta. Brown rice. Bulgar wheat. Polenta. Couscous. Whole-wheat bread. Orpah Cobb.  Vegetables:  Artichokes. Beets. Broccoli. Cabbage. Carrots. Eggplant. Green beans. Chard. Kale. Spinach. Onions. Leeks. Peas. Squash. Tomatoes. Peppers. Radishes.  Fruits: Apples. Apricots. Avocado. Berries. Bananas. Cherries. Dates. Figs. Grapes. Lemons. Melon. Oranges. Peaches. Plums. Pomegranate.  Meats and other protein foods: Beans. Almonds. Sunflower seeds. Pine nuts. Peanuts. Cod. Salmon. Scallops. Shrimp. Tuna. Tilapia. Clams. Oysters. Eggs.  Dairy: Low-fat milk. Cheese. Greek yogurt.  Beverages: Water. Red wine. Herbal tea.  Fats and oils: Extra virgin olive oil. Avocado oil. Grape seed oil.  Sweets and desserts: [place] yogurt with honey. Baked apples. Poached pears. Trail mix.  Seasoning and other foods: Basil. Cilantro.  Coriander. Cumin. Mint. Parsley. Sage. Rosemary. Tarragon. Garlic. Oregano. Thyme. Pepper. Balsalmic vinegar. Tahini. Hummus. Tomato sauce. Olives. Mushrooms. ? Limit these  Grains: Prepackaged pasta or rice dishes. Prepackaged cereal with added sugar.  Vegetables: Deep fried potatoes (french fries).  Fruits: Fruit canned in syrup.  Meats and other protein foods: Beef. Pork. Lamb. Poultry with skin. Hot dogs. Tomasa Blase.  Dairy: Ice cream. Sour cream. Whole milk.  Beverages: Juice. Sugar-sweetened soft drinks. Beer. Liquor and spirits.  Fats and oils: Butter. Canola oil. Vegetable oil. Beef fat (tallow). Lard.  Sweets and desserts: Cookies. Cakes. Pies. Candy.  Seasoning and other foods: Mayonnaise. Premade sauces and marinades. The items listed may not be a complete list. Talk with your dietitian about what dietary choices are right for you. Summary  The Mediterranean diet includes both food and lifestyle choices.  Eat a variety of fresh fruits and vegetables, beans, nuts, seeds, and whole grains.  Limit the amount of red meat and sweets that you eat.  Talk with your health care provider about whether it is safe for you to drink red wine in moderation. This means 1 glass a day for nonpregnant women and 2 glasses a day for men. A glass of wine equals 5 oz (150 mL). This information is not intended to replace advice given to you by your health care provider. Make sure you discuss any questions you have with your health care provider. Document Revised: 12/19/2015 Document Reviewed: 12/12/2015 Elsevier Patient Education  2020 [employer].

## 2020-02-16 ENCOUNTER — Encounter: Payer: Self-pay | Admitting: Nurse Practitioner

## 2020-02-16 DIAGNOSIS — L83 Acanthosis nigricans: Secondary | ICD-10-CM | POA: Insufficient documentation

## 2020-02-16 DIAGNOSIS — Z6835 Body mass index (BMI) 35.0-35.9, adult: Secondary | ICD-10-CM | POA: Insufficient documentation

## 2020-02-16 NOTE — Assessment & Plan Note (Signed)
Persistent cough and intermittent wheezing Some improvement with albuterol and antihistamine Does not use qvar daily.  Increase Qvar to 2puffs BID Up in 67month

## 2020-02-16 NOTE — Assessment & Plan Note (Addendum)
Acanthosis nigrican on neck and axilla region. Add her about correlation to insulin resistance, cancer and heart disease. Advised about importance of low carb/low fat diet and regular exercise. Provided printed material. Check HgbA1c, lipid panel and TSH Unable to refer to nutritionist due to lack of medical insurance.

## 2020-02-19 LAB — VITAMIN D 1,25 DIHYDROXY
Vitamin D 1, 25 (OH)2 Total: 35 pg/mL (ref 18–72)
Vitamin D2 1, 25 (OH)2: <8 pg/mL
Vitamin D3 1, 25 (OH)2: 35 pg/mL

## 2020-03-01 ENCOUNTER — Encounter: Payer: Self-pay | Admitting: Nurse Practitioner

## 2020-03-01 ENCOUNTER — Other Ambulatory Visit: Payer: Self-pay

## 2020-03-01 ENCOUNTER — Ambulatory Visit (INDEPENDENT_AMBULATORY_CARE_PROVIDER_SITE_OTHER): Payer: Self-pay

## 2020-03-01 ENCOUNTER — Encounter (HOSPITAL_COMMUNITY): Payer: Self-pay | Admitting: Emergency Medicine

## 2020-03-01 ENCOUNTER — Ambulatory Visit (HOSPITAL_COMMUNITY)
Admission: EM | Admit: 2020-03-01 | Discharge: 2020-03-01 | Disposition: A | Discharge status: Home / Self Care | Payer: Self-pay | Attending: Family Medicine | Admitting: Family Medicine

## 2020-03-01 DIAGNOSIS — S99922A Unspecified injury of left foot, initial encounter: Secondary | ICD-10-CM

## 2020-03-01 DIAGNOSIS — M79675 Pain in left toe(s): Secondary | ICD-10-CM

## 2020-03-01 DIAGNOSIS — S93509A Unspecified sprain of unspecified toe(s), initial encounter: Secondary | ICD-10-CM

## 2020-03-01 HISTORY — DX: Prediabetes: R73.03

## 2020-03-01 HISTORY — DX: Unspecified osteoarthritis, unspecified site: M19.90

## 2020-03-01 MED ORDER — NAPROXEN 500 MG PO TABS: 500.0000 mg | ORAL_TABLET | Freq: Two times a day (BID) | ORAL | 0 refills | Status: DC

## 2020-03-01 NOTE — ED Provider Notes (Signed)
[facility]    CSN: [id number] Arrival date & time: 03/01/20  0818      History   Chief Complaint Chief Complaint  Patient presents with  . Foot Pain    HPI Tara Joseph is a 29 y.o. female.   Tara Joseph presents with complaints of pain to left foot, primarily at left great toe. She was jogging two days ago and tripped, jamming her toes. Pain since. Tender at great toe and toenail. Pain with weight bearing. She is on her feet at work which causes pain. She feels her pain has mildly improved this morning. No previous foot injury. No numbness or tingling. She has taken a muscle relaxer which seemed to help some with pain.    ROS per HPI, negative if not otherwise mentioned.      Past Medical History:  Diagnosis Date  . Arthritis   . Prediabetes     There are no problems to display for this patient.   History reviewed. No pertinent surgical history.  OB History   No obstetric history on file.      Home Medications    Prior to Admission medications   Medication Sig Start Date End Date Taking? Authorizing Provider  naproxen (NAPROSYN) 500 MG tablet Take 1 tablet (500 mg total) by mouth 2 (two) times daily. 03/01/20   Georgetta Haber, NP    Family History History reviewed. No pertinent family history.  Social History Social History   Tobacco Use  . Smoking status: Never Smoker  . Smokeless tobacco: Never Used  Substance Use Topics  . Alcohol use: Not Currently  . Drug use: Not Currently     Allergies   Patient has no allergy information on record.   Review of Systems Review of Systems   Physical Exam Triage Vital Signs ED Triage Vitals  Enc Vitals Group     BP 03/01/20 0906 126/82     Pulse Rate 03/01/20 0906 92     Resp 03/01/20 0906 13     Temp 03/01/20 0906 98.2 F (36.8 C)     Temp Source 03/01/20 0906 Oral     SpO2 03/01/20 0906 98 %     Weight 03/01/20 0901 184 lb (83.5 kg)     Height 03/01/20 0901 5' (1.524 m)       Head Circumference --      Peak Flow --      Pain Score 03/01/20 0901 10     Pain Loc --      Pain Edu? --      Excl. in GC? --    No data found.  Updated Vital Signs BP 126/82 (BP Location: Left Arm)   Pulse 92   Temp 98.2 F (36.8 C) (Oral)   Resp 13   Ht 5' (1.524 m)   Wt 184 lb (83.5 kg)   LMP 02/16/2020   SpO2 98%   BMI 35.94 kg/m    Physical Exam Constitutional:      General: She is not in acute distress.    Appearance: She is well-developed.  Cardiovascular:     Rate and Rhythm: Normal rate.  Pulmonary:     Effort: Pulmonary effort is normal.  Musculoskeletal:     Left foot: Normal range of motion. Tenderness and bony tenderness present. No swelling, deformity, bunion or laceration.     Comments: Tenderness to left foot great toe MTP joint as well as proximal and distal phalanx; tenderness at toe nail without obvious  subungual hematoma present; cap refill < 2 seconds; full ROM of toe but with some pain; tenderness to left foot 3rd/4th MTP joints as well, less severe than great toe, however  Skin:    General: Skin is warm and dry.  Neurological:     Mental Status: She is alert and oriented to person, place, and time.      UC Treatments / Results  Labs (all labs ordered are listed, but only abnormal results are displayed) Labs Reviewed - No data to display  EKG   Radiology DG Foot Complete Left  Result Date: 03/01/2020 CLINICAL DATA:  Great toe pain and fourth toe pain after injury EXAM: LEFT FOOT - COMPLETE 3+ VIEW COMPARISON:  None. FINDINGS: There is no evidence of fracture or dislocation. Hallux valgus alignment with preservation of the first MTP joint space. There is no evidence of arthropathy or other focal bone abnormality. Soft tissues are unremarkable. IMPRESSION: Negative. Electronically Signed   By: Duanne Guess D.O.   On: 03/01/2020 09:25    Procedures Procedures (including critical care time)  Medications Ordered in  UC Medications - No data to display  Initial Impression / Assessment and Plan / UC Course  I have reviewed the triage vital signs and the nursing notes.  Pertinent labs & imaging results that were available during my care of the patient were reviewed by me and considered in my medical decision making (see chart for details).     Toe / foot strain from trip injury two days ago. Normal xray. Post op shoe provided. nsaids recommended. Return precautions provided. Patient verbalized understanding and agreeable to plan.   Final Clinical Impressions(s) / UC Diagnoses   Final diagnoses:  Sprain of toe, initial encounter     Discharge Instructions     Ice, elevation, and shoe provided, as needed to help with pain.  With the bruising to your toe nail I wouldn't be surprised if at some point in the upcoming weeks this nail falls off. A new nail will grow in just fine.  Naproxen twice a day, as needed, for pain. Take with food.  Activity as tolerated.     ED Prescriptions    Medication Sig Dispense Auth. Provider   naproxen (NAPROSYN) 500 MG tablet Take 1 tablet (500 mg total) by mouth 2 (two) times daily. 30 tablet Georgetta Haber, NP     PDMP not reviewed this encounter.   Georgetta Haber, NP 03/01/20 [phone]

## 2020-03-01 NOTE — ED Triage Notes (Signed)
Patient c/o LFT foot pain that started yesterday. Patient stated that she was running and tripped when pain began.   Patient stated the "big toe and and fourth toe hurts".   Patient tried ice and a muscle relaxants w/o any relief of symptoms.

## 2020-03-01 NOTE — Discharge Instructions (Addendum)
Ice, elevation, and shoe provided, as needed to help with pain.  With the bruising to your toe nail I wouldn't be surprised if at some point in the upcoming weeks this nail falls off. A new nail will grow in just fine.  Naproxen twice a day, as needed, for pain. Take with food.  Activity as tolerated.

## 2020-03-11 ENCOUNTER — Encounter: Payer: Self-pay | Admitting: Nurse Practitioner

## 2020-03-11 ENCOUNTER — Other Ambulatory Visit: Payer: Self-pay

## 2020-03-11 ENCOUNTER — Ambulatory Visit (INDEPENDENT_AMBULATORY_CARE_PROVIDER_SITE_OTHER): Payer: Self-pay | Admitting: Nurse Practitioner

## 2020-03-11 VITALS — BP 120/76 | HR 86 | Temp 97.2°F | Ht 60.0 in | Wt 187.2 lb

## 2020-03-11 DIAGNOSIS — S99922D Unspecified injury of left foot, subsequent encounter: Secondary | ICD-10-CM

## 2020-03-11 DIAGNOSIS — S90212D Contusion of left great toe with damage to nail, subsequent encounter: Secondary | ICD-10-CM

## 2020-03-11 NOTE — Progress Notes (Signed)
Subjective:  Patient ID: Tara Joseph, female    DOB: Oct 30, 1990  Age: 29 y.o. MRN: [id number]  CC: Acute Visit (Pt was exercising, tripped, fell and injuried her left big toe on 02/29/20. Toe was swollen, bruised and unable to bend but she states she can bend it now but it hurts when pressure is applied to it and it is still bruised. )  Toe Pain  The incident occurred 5 to 7 days ago. The incident occurred at home. The injury mechanism was a direct blow. The pain is present in the left toes. The quality of the pain is described as aching. The pain is moderate. The pain has been constant since onset. Pertinent negatives include no inability to bear weight, loss of motion, loss of sensation, muscle weakness, numbness or tingling. She reports no foreign bodies present. The symptoms are aggravated by weight bearing. She has tried rest and elevation for the symptoms. The treatment provided moderate relief.  reviewed toe x-ray completd by ED provider (no fracture). She was provided with surgical shoe Improving toe pain and swelling, needs work note due to use of steel toe boots at work.  Reviewed past Medical, Social and Family history today.  Outpatient Medications Prior to Visit  Medication Sig Dispense Refill  . albuterol (VENTOLIN HFA) 108 (90 Base) MCG/ACT inhaler Inhale 1-2 puffs into the lungs every 6 (six) hours as needed for wheezing or shortness of breath. 8 g 0  . beclomethasone (QVAR) 40 MCG/ACT inhaler Inhale 2 puffs into the lungs 2 (two) times daily. Rinse mouth after each use 1 each 2  . cetirizine (ZYRTEC) 10 MG tablet Take 1 tablet (10 mg total) by mouth at bedtime. 30 tablet 2  . cyclobenzaprine (FLEXERIL) 5 MG tablet Take 1 tablet (5 mg total) by mouth 2 (two) times daily as needed for muscle spasms. 10 tablet 0  . naproxen (NAPROSYN) 500 MG tablet Take 1 tablet (500 mg total) by mouth 2 (two) times daily. 30 tablet 0  . naproxen (NAPROSYN) 500 MG tablet Take 1 tablet (500 mg  total) by mouth 2 (two) times daily. 30 tablet 0   No facility-administered medications prior to visit.   ROS See HPI  Objective:  BP 120/76 (BP Location: Left Arm, Patient Position: Sitting, Cuff Size: Large)   Pulse 86   Temp (!) 97.2 F (36.2 C) (Temporal)   Ht 5' (1.524 m)   Wt 187 lb 3.2 oz (84.9 kg)   LMP 02/16/2020   SpO2 98%   BMI 36.56 kg/m   Physical Exam Vitals reviewed.  Cardiovascular:     Pulses:          Dorsalis pedis pulses are 2+ on the right side and 2+ on the left side.       Posterior tibial pulses are 2+ on the right side and 2+ on the left side.  Musculoskeletal:        General: Tenderness present. No swelling.       Feet:  Feet:     Right foot:     Skin integrity: No skin breakdown or erythema.     Left foot:     Skin integrity: No skin breakdown or erythema.  Neurological:     Mental Status: She is alert.    Assessment & Plan:  This visit occurred during the SARS-CoV-2 public health emergency.  Safety protocols were in place, including screening questions prior to the visit, additional usage of staff PPE, and extensive cleaning of exam  room while observing appropriate contact time as indicated for disinfecting solutions.   Soua was seen today for acute visit.  Diagnoses and all orders for this visit:  Injury of toe on left foot, subsequent encounter  Subungual hematoma of great toe of left foot, subsequent encounter  Worknote provided for an additional weel Continue use of surgical shoe Apply cold compress and elevated as much as possible  Problem List Items Addressed This Visit    None    Visit Diagnoses    Injury of toe on left foot, subsequent encounter    -  Primary   Subungual hematoma of great toe of left foot, subsequent encounter          Follow-up: No follow-ups on file.  Alysia Penna, NP

## 2020-03-26 ENCOUNTER — Telehealth: Payer: Self-pay | Admitting: Nurse Practitioner

## 2020-05-06 ENCOUNTER — Telehealth: Payer: Self-pay | Admitting: Nurse Practitioner

## 2020-05-06 ENCOUNTER — Encounter: Payer: Self-pay | Admitting: Nurse Practitioner

## 2020-05-06 NOTE — Telephone Encounter (Signed)
Pt was no show for appt 03/26/2020 virtual f/u. 1st occurrence.  Fee waived Letter mailed.

## 2021-05-08 ENCOUNTER — Encounter (HOSPITAL_COMMUNITY): Payer: Self-pay | Admitting: Emergency Medicine

## 2021-05-08 ENCOUNTER — Emergency Department (HOSPITAL_COMMUNITY)
Admission: EM | Admit: 2021-05-08 | Discharge: 2021-05-09 | Disposition: A | Discharge status: Home / Self Care | Payer: 59 | Attending: Emergency Medicine | Admitting: Emergency Medicine

## 2021-05-08 ENCOUNTER — Other Ambulatory Visit: Payer: Self-pay

## 2021-05-08 DIAGNOSIS — R1030 Lower abdominal pain, unspecified: Secondary | ICD-10-CM | POA: Diagnosis present

## 2021-05-08 DIAGNOSIS — N9489 Other specified conditions associated with female genital organs and menstrual cycle: Secondary | ICD-10-CM | POA: Insufficient documentation

## 2021-05-08 DIAGNOSIS — N3000 Acute cystitis without hematuria: Secondary | ICD-10-CM | POA: Diagnosis not present

## 2021-05-08 LAB — CBC
HCT: 36.3 % (ref 36.0–46.0)
Hemoglobin: 11.1 g/dL — ABNORMAL LOW (ref 12.0–15.0)
MCH: 23.6 pg — ABNORMAL LOW (ref 26.0–34.0)
MCHC: 30.6 g/dL (ref 30.0–36.0)
MCV: 77.2 fL — ABNORMAL LOW (ref 80.0–100.0)
Platelets: 389 10*3/uL (ref 150–400)
RBC: 4.7 MIL/uL (ref 3.87–5.11)
RDW: 16 % — ABNORMAL HIGH (ref 11.5–15.5)
WBC: 7.9 10*3/uL (ref 4.0–10.5)
nRBC: 0 % (ref 0.0–0.2)

## 2021-05-08 LAB — URINALYSIS, ROUTINE W REFLEX MICROSCOPIC
Bilirubin Urine: NEGATIVE
Glucose, UA: NEGATIVE mg/dL
Hgb urine dipstick: NEGATIVE
Ketones, ur: NEGATIVE mg/dL
Nitrite: NEGATIVE
Protein, ur: NEGATIVE mg/dL
Specific Gravity, Urine: 1.023 (ref 1.005–1.030)
pH: 6 (ref 5.0–8.0)

## 2021-05-08 LAB — COMPREHENSIVE METABOLIC PANEL
ALT: 22 U/L (ref 0–44)
AST: 22 U/L (ref 15–41)
Albumin: 3.8 g/dL (ref 3.5–5.0)
Alkaline Phosphatase: 76 U/L (ref 38–126)
Anion gap: 6 (ref 5–15)
BUN: 13 mg/dL (ref 6–20)
CO2: 25 mmol/L (ref 22–32)
Calcium: 8.8 mg/dL — ABNORMAL LOW (ref 8.9–10.3)
Chloride: 107 mmol/L (ref 98–111)
Creatinine, Ser: 0.59 mg/dL (ref 0.44–1.00)
GFR, Estimated: >60 mL/min (ref >60)
Glucose, Bld: 131 mg/dL — ABNORMAL HIGH (ref 70–99)
Potassium: 3.8 mmol/L (ref 3.5–5.1)
Sodium: 138 mmol/L (ref 135–145)
Total Bilirubin: 0.5 mg/dL (ref 0.3–1.2)
Total Protein: 6.7 g/dL (ref 6.5–8.1)

## 2021-05-08 LAB — I-STAT BETA HCG BLOOD, ED (MC, WL, AP ONLY): I-stat hCG, quantitative: <5 m[IU]/mL (ref <5)

## 2021-05-08 LAB — LIPASE, BLOOD: Lipase: 36 U/L (ref 11–51)

## 2021-05-08 MED ORDER — ONDANSETRON 4 MG PO TBDP
4.0000 mg | ORAL_TABLET | Freq: Once | ORAL | Status: AC | PRN
  Administered 2021-05-08: 4 mg via ORAL
  Filled 2021-05-08: qty 1

## 2021-05-08 NOTE — ED Triage Notes (Signed)
Pt with lower medial ab pain yesterday and that migrated to bilateral upper ab pain with burning and sharp sensation that worsens with eating. Endorses dysuria and pain with bowel movement. Some diarrhea today. Denies pregnancy. No fever. Has some nausea. Decreased appetite. No meds PTA

## 2021-05-09 MED ORDER — DICYCLOMINE HCL 20 MG PO TABS: 20.0000 mg | ORAL_TABLET | Freq: Two times a day (BID) | ORAL | 0 refills | Status: DC

## 2021-05-09 MED ORDER — ONDANSETRON 4 MG PO TBDP: 4.0000 mg | ORAL_TABLET | Freq: Three times a day (TID) | ORAL | 0 refills | Status: DC | PRN

## 2021-05-09 MED ORDER — CEPHALEXIN 500 MG PO CAPS
500.0000 mg | ORAL_CAPSULE | Freq: Three times a day (TID) | ORAL | 0 refills | Status: AC
Start: 2021-05-09 — End: 2021-05-14

## 2021-05-09 NOTE — Discharge Instructions (Signed)
You were seen given that we are not able to do a pelvic exam and full examination today I strongly recommend that you have a follow-up appointment with your primary care doctor expediently.  If your symptoms worsen as we discussed please return to the emergency room.  Take Keflex as prescribed for the entire course even if your symptoms improve.  A urine culture is being run on your urine sample.  We will call you if an adjustment to your medications needs to be made.  I prescribed you Zofran for nausea and Bentyl which I would like you to take twice daily for abdominal pain.  This will help with this crampy abdominal pain is due to intestinal spasm.

## 2021-05-09 NOTE — ED Provider Notes (Signed)
[facility] EMERGENCY DEPARTMENT Provider Note   CSN: [id number] Arrival date & time: 05/08/21  1440     History  Chief Complaint  Patient presents with   Abdominal Pain    Tara Joseph is a 31 y.o. female.   Abdominal Pain Associated symptoms: diarrhea and nausea   Associated symptoms: no chest pain, no chills, no cough, no dysuria, no fever, no shortness of breath and no vomiting    Patient is a 31 year old female presented emergency room today with complaints of lower abdominal pain she states that it seems to be primarily directly in the middle of her lower abdomen.  She states that the pain began Wednesday now approximately 48 hours ago  She endorses nausea without vomiting.  She states that she has had some burning with urination and also has some crampy abdominal pain seems to worsen she is pooping denies any blood in her stool she states that she had 1 episode of diarrhea today that was loose but not watery.  Has not had any fever.  No cough congestion lightheadedness or dizziness.  She has not tried any medications prior to arrival.  Her only medical history is a history of UTIs and asthma  She has no history of abdominal surgeries.  No other associate symptoms.  No aggravating factors or mitigating factors.     Home Medications Prior to Admission medications   Medication Sig Start Date End Date Taking? Authorizing Provider  albuterol (VENTOLIN HFA) 108 (90 Base) MCG/ACT inhaler Inhale 1-2 puffs into the lungs every 6 (six) hours as needed for wheezing or shortness of breath. 01/11/20  Yes Nche, Charlene Brooke, NP  ibuprofen (ADVIL) 200 MG tablet Take 600-800 mg by mouth every 6 (six) hours as needed for headache or moderate pain.   Yes [provider]  beclomethasone (QVAR) 40 MCG/ACT inhaler Inhale 2 puffs into the lungs 2 (two) times daily. Rinse mouth after each use Patient not taking: Reported on 05/09/2021 02/14/20   Nche, Charlene Brooke,  NP  cephALEXin (KEFLEX) 500 MG capsule Take 1 capsule (500 mg total) by mouth 3 (three) times daily for 5 days. 05/09/21 05/14/21 Yes Zaine Elsass S, PA  cetirizine (ZYRTEC) 10 MG tablet Take 1 tablet (10 mg total) by mouth at bedtime. Patient not taking: Reported on 05/09/2021 01/11/20   Nche, Charlene Brooke, NP  cyclobenzaprine (FLEXERIL) 5 MG tablet Take 1 tablet (5 mg total) by mouth 2 (two) times daily as needed for muscle spasms. Patient not taking: Reported on 05/09/2021 12/17/19   Hall-Potvin, [place], PA-C  dicyclomine (BENTYL) 20 MG tablet Take 1 tablet (20 mg total) by mouth 2 (two) times daily. 05/09/21  Yes Easter Kennebrew S, PA  naproxen (NAPROSYN) 500 MG tablet Take 1 tablet (500 mg total) by mouth 2 (two) times daily. Patient not taking: Reported on 05/09/2021 03/01/20   Augusto Gamble B, NP  ondansetron (ZOFRAN-ODT) 4 MG disintegrating tablet Take 1 tablet (4 mg total) by mouth every 8 (eight) hours as needed for nausea or vomiting. 05/09/21  Yes Tedd Sias, PA      Allergies    Patient has no known allergies.    Review of Systems   Review of Systems  Constitutional:  Negative for chills and fever.  HENT:  Negative for congestion.   Eyes:  Negative for pain.  Respiratory:  Negative for cough and shortness of breath.   Cardiovascular:  Negative for chest pain and leg swelling.  Gastrointestinal:  Positive for  abdominal pain, diarrhea and nausea. Negative for vomiting.  Genitourinary:  Negative for dysuria.  Musculoskeletal:  Negative for myalgias.  Skin:  Negative for rash.  Neurological:  Negative for dizziness and headaches.   Physical Exam Updated Vital Signs BP (!) 149/92 (BP Location: Right Arm)    Pulse 84    Temp 98.6 F (37 C)    Resp 16    LMP 04/08/2021 (Approximate)    SpO2 100%  Physical Exam Vitals and nursing note reviewed.  Constitutional:      General: She is not in acute distress. HENT:     Head: Normocephalic and atraumatic.     Nose: Nose normal.   Eyes:     General: No scleral icterus. Cardiovascular:     Rate and Rhythm: Normal rate and regular rhythm.     Pulses: Normal pulses.     Heart sounds: Normal heart sounds.  Pulmonary:     Effort: Pulmonary effort is normal. No respiratory distress.     Breath sounds: No wheezing.  Abdominal:     Palpations: Abdomen is soft.     Tenderness: There is abdominal tenderness.     Comments: Mild suprapubic tenderness with palpation.  No right lower quadrant tenderness, negative McBurney and Rovsing.  No CVA tenderness.  Negative Murphy sign.  Musculoskeletal:     Cervical back: Normal range of motion.     Right lower leg: No edema.     Left lower leg: No edema.  Skin:    General: Skin is warm and dry.     Capillary Refill: Capillary refill takes less than 2 seconds.  Neurological:     Mental Status: She is alert. Mental status is at baseline.  Psychiatric:        Mood and Affect: Mood normal.        Behavior: Behavior normal.    ED Results / Procedures / Treatments   Labs (all labs ordered are listed, but only abnormal results are displayed) Labs Reviewed  COMPREHENSIVE METABOLIC PANEL - Abnormal; Notable for the following components:      Result Value   Glucose, Bld 131 (*)    Calcium 8.8 (*)    All other components within normal limits  CBC - Abnormal; Notable for the following components:   Hemoglobin 11.1 (*)    MCV 77.2 (*)    MCH 23.6 (*)    RDW 16.0 (*)    All other components within normal limits  URINALYSIS, ROUTINE W REFLEX MICROSCOPIC - Abnormal; Notable for the following components:   APPearance HAZY (*)    Leukocytes,Ua MODERATE (*)    Bacteria, UA RARE (*)    All other components within normal limits  URINE CULTURE  LIPASE, BLOOD  I-STAT BETA HCG BLOOD, ED ([facility], [facility], [facility] ONLY)    EKG None  Radiology No results found.  Procedures Procedures    Medications Ordered in ED Medications  ondansetron (ZOFRAN-ODT) disintegrating tablet 4 mg (4 mg Oral  Given 05/08/21 1532)    ED Course/ Medical Decision Making/ A&P                           Medical Decision Making  Patient is a 31 year old female with no pertinent past medical history apart from that detailed in HPI and no surgical history.  Is complaining of some lower abdominal pain seems to be in the mid lower abdomen and also complaining of some upper abdominal pain which seems  to have resolved during her 18-hour ER visit.  She states she has had 2 or 3 episodes of dysuria and states that she would like to be treated for urinary tract infection.  She denies any fevers or chills denies any significant abdominal pain so much as more pelvic pain.  She tells me that she has some vaginal discharge but declines pelvic exam and states that she would prefer to be treated for urinary tract infection and states that she can follow-up with her primary care provider expediently.  She states she is not sexually active and has never had sex.  On physical exam patient does have some mild suprapubic tenderness with palpation.  Otherwise her abdomen is soft and nontender   Vital signs within normal limits apart from mildly elevated blood pressure.  CMP unremarkable CBC without leukocytosis mild anemia present.  Urine with rare bacteria moderate leukocytes urine culture obtained we will go ahead and treat for UTI with Keflex.  We will also provide Bentyl and Zofran for pain and nausea.  Patient tolerating p.o. prior to discharge.  Ambulatory and well-appearing.  Will discharge home at this time.  Final Clinical Impression(s) / ED Diagnoses Final diagnoses:  Lower abdominal pain  Acute cystitis without hematuria    Rx / DC Orders ED Discharge Orders          Ordered    ondansetron (ZOFRAN-ODT) 4 MG disintegrating tablet  Every 8 hours PRN        05/09/21 0836    dicyclomine (BENTYL) 20 MG tablet  2 times daily        05/09/21 0836    cephALEXin (KEFLEX) 500 MG capsule  3 times daily        05/09/21  0836              Tedd Sias, PA 05/10/21 1619    Tegeler, Gwenyth Allegra, MD 05/11/21 2303

## 2021-05-10 LAB — URINE CULTURE: Culture: 70000 — AB

## 2021-05-11 ENCOUNTER — Telehealth (HOSPITAL_BASED_OUTPATIENT_CLINIC_OR_DEPARTMENT_OTHER): Payer: Self-pay | Admitting: *Deleted

## 2021-05-11 NOTE — Telephone Encounter (Signed)
Post ED Visit - Positive Culture Follow-up  Culture report reviewed by antimicrobial stewardship pharmacist: [facility] []  , Pharm.D. []  Enzo Bi, Pharm.D., BCPS AQ-ID []  , Pharm.D., BCPS []  Celedonio Miyamoto, .D., BCPS []  [place], .D., BCPS, AAHIVP []  Georgina Pillion, Pharm.D., BCPS, AAHIVP []  [address], PharmD, BCPS []  , PharmD, BCPS []  [place], PharmD, BCPS []  [address], PharmD []  , PharmD, BCPS [x]  Estella Husk, PharmD  Pharmacy Team []  Lysle Pearl, PharmD []  , PharmD []  Phillips Climes, PharmD []  , Rph []  Agapito Games) , PharmD []  Verlan Friends, PharmD []  , PharmD []  Mervyn Gay, PharmD []  , PharmD []  Conni Elliot, PharmD []  [facility], PharmD []  , PharmD []  Len Childs, PharmD   Positive urine culture Treated with Cephalexin, organism sensitive to the same and no further patient follow-up is required at this time.  05/11/2021, 12:21 PM

## 2021-05-14 ENCOUNTER — Emergency Department (HOSPITAL_BASED_OUTPATIENT_CLINIC_OR_DEPARTMENT_OTHER): Payer: 59

## 2021-05-14 ENCOUNTER — Encounter (HOSPITAL_BASED_OUTPATIENT_CLINIC_OR_DEPARTMENT_OTHER): Payer: Self-pay | Admitting: *Deleted

## 2021-05-14 ENCOUNTER — Other Ambulatory Visit: Payer: Self-pay

## 2021-05-14 ENCOUNTER — Emergency Department (HOSPITAL_BASED_OUTPATIENT_CLINIC_OR_DEPARTMENT_OTHER)
Admission: EM | Admit: 2021-05-14 | Discharge: 2021-05-14 | Disposition: A | Discharge status: Home / Self Care | Payer: 59 | Attending: Emergency Medicine | Admitting: Emergency Medicine

## 2021-05-14 DIAGNOSIS — R1011 Right upper quadrant pain: Secondary | ICD-10-CM | POA: Diagnosis present

## 2021-05-14 DIAGNOSIS — N390 Urinary tract infection, site not specified: Secondary | ICD-10-CM | POA: Diagnosis not present

## 2021-05-14 DIAGNOSIS — R1084 Generalized abdominal pain: Secondary | ICD-10-CM

## 2021-05-14 DIAGNOSIS — Z79899 Other long term (current) drug therapy: Secondary | ICD-10-CM | POA: Diagnosis not present

## 2021-05-14 LAB — COMPREHENSIVE METABOLIC PANEL
ALT: 23 U/L (ref 0–44)
AST: 20 U/L (ref 15–41)
Albumin: 4 g/dL (ref 3.5–5.0)
Alkaline Phosphatase: 70 U/L (ref 38–126)
Anion gap: 9 (ref 5–15)
BUN: 15 mg/dL (ref 6–20)
CO2: 21 mmol/L — ABNORMAL LOW (ref 22–32)
Calcium: 9.3 mg/dL (ref 8.9–10.3)
Chloride: 105 mmol/L (ref 98–111)
Creatinine, Ser: 0.7 mg/dL (ref 0.44–1.00)
GFR, Estimated: >60 mL/min (ref >60)
Glucose, Bld: 107 mg/dL — ABNORMAL HIGH (ref 70–99)
Potassium: 3.8 mmol/L (ref 3.5–5.1)
Sodium: 135 mmol/L (ref 135–145)
Total Bilirubin: 0.3 mg/dL (ref 0.3–1.2)
Total Protein: 7.6 g/dL (ref 6.5–8.1)

## 2021-05-14 LAB — PREGNANCY, URINE: Preg Test, Ur: NEGATIVE

## 2021-05-14 LAB — CBC
HCT: 36.7 % (ref 36.0–46.0)
Hemoglobin: 11.8 g/dL — ABNORMAL LOW (ref 12.0–15.0)
MCH: 24.4 pg — ABNORMAL LOW (ref 26.0–34.0)
MCHC: 32.2 g/dL (ref 30.0–36.0)
MCV: 76 fL — ABNORMAL LOW (ref 80.0–100.0)
Platelets: 419 10*3/uL — ABNORMAL HIGH (ref 150–400)
RBC: 4.83 MIL/uL (ref 3.87–5.11)
RDW: 16.5 % — ABNORMAL HIGH (ref 11.5–15.5)
WBC: 7.5 10*3/uL (ref 4.0–10.5)
nRBC: 0 % (ref 0.0–0.2)

## 2021-05-14 LAB — URINALYSIS, ROUTINE W REFLEX MICROSCOPIC
Bilirubin Urine: NEGATIVE
Glucose, UA: NEGATIVE mg/dL
Hgb urine dipstick: NEGATIVE
Ketones, ur: NEGATIVE mg/dL
Leukocytes,Ua: NEGATIVE
Nitrite: NEGATIVE
Protein, ur: NEGATIVE mg/dL
Specific Gravity, Urine: >1.03 — ABNORMAL HIGH (ref 1.005–1.030)
pH: 5.5 (ref 5.0–8.0)

## 2021-05-14 LAB — LIPASE, BLOOD: Lipase: 33 U/L (ref 11–51)

## 2021-05-14 MED ORDER — CULTURELLE PROBIOTICS PO CHEW
CHEWABLE_TABLET | ORAL | 0 refills | Status: AC
Start: 2021-05-14 — End: ?

## 2021-05-14 NOTE — ED Triage Notes (Signed)
C/o cont right lower abd pain x 6 days, DX UTI improved

## 2021-05-14 NOTE — ED Provider Notes (Signed)
[facility] EMERGENCY DEPARTMENT Provider Note   CSN: [id number] Arrival date & time: 05/14/21  1531     History  Chief Complaint  Patient presents with   Abdominal Pain    Tara Joseph is a 31 y.o. female.  Pt seen in Ed on 1/5 and diagnosed with a uti. Pt has taken 5 days of antibiotics.  Pt reports she still has abdominal soreness.  Pt has had some discomfort after eating.  Pt has had some nausea since starting antibiotic   The history is provided by the patient. No language interpreter was used.  Abdominal Pain Pain location:  RUQ and RLQ Pain quality: aching   Pain radiates to:  Does not radiate Pain severity:  Moderate Onset quality:  Gradual Duration:  5 days Timing:  Constant Progression:  Worsening Chronicity:  New Relieved by:  Nothing Worsened by:  Nothing Ineffective treatments:  None tried Associated symptoms: no anorexia and no dysuria   Risk factors: has not had multiple surgeries and not pregnant       Home Medications Prior to Admission medications   Medication Sig Start Date End Date Taking? Authorizing Provider  Probiotic Product (CULTURELLE PROBIOTICS) CHEW Take one a day for 2 weeks 05/14/21  Yes Caryl Ada K, PA-C  albuterol (VENTOLIN HFA) 108 (90 Base) MCG/ACT inhaler Inhale 1-2 puffs into the lungs every 6 (six) hours as needed for wheezing or shortness of breath. 01/11/20   Nche, Charlene Brooke, NP  beclomethasone (QVAR) 40 MCG/ACT inhaler Inhale 2 puffs into the lungs 2 (two) times daily. Rinse mouth after each use Patient not taking: Reported on 05/09/2021 02/14/20   Nche, Charlene Brooke, NP  cephALEXin (KEFLEX) 500 MG capsule Take 1 capsule (500 mg total) by mouth 3 (three) times daily for 5 days. 05/09/21 05/14/21  Tedd Sias, PA  cetirizine (ZYRTEC) 10 MG tablet Take 1 tablet (10 mg total) by mouth at bedtime. Patient not taking: Reported on 05/09/2021 01/11/20   Nche, Charlene Brooke, NP  cyclobenzaprine (FLEXERIL) 5 MG tablet Take 1  tablet (5 mg total) by mouth 2 (two) times daily as needed for muscle spasms. Patient not taking: Reported on 05/09/2021 12/17/19   Hall-Potvin, [place], PA-C  dicyclomine (BENTYL) 20 MG tablet Take 1 tablet (20 mg total) by mouth 2 (two) times daily. 05/09/21   Tedd Sias, PA  ibuprofen (ADVIL) 200 MG tablet Take 600-800 mg by mouth every 6 (six) hours as needed for headache or moderate pain.    [provider]  naproxen (NAPROSYN) 500 MG tablet Take 1 tablet (500 mg total) by mouth 2 (two) times daily. Patient not taking: Reported on 05/09/2021 03/01/20   Augusto Gamble B, NP  ondansetron (ZOFRAN-ODT) 4 MG disintegrating tablet Take 1 tablet (4 mg total) by mouth every 8 (eight) hours as needed for nausea or vomiting. 05/09/21   Tedd Sias, PA      Allergies    Patient has no known allergies.    Review of Systems   Review of Systems  Gastrointestinal:  Positive for abdominal pain. Negative for anorexia.  Genitourinary:  Negative for dysuria.  All other systems reviewed and are negative.  Physical Exam Updated Vital Signs BP (!) 147/95 (BP Location: Right Arm)    Pulse (!) 102    Temp 98.8 F (37.1 C) (Oral)    Resp 18    Ht 5' (1.524 m)    Wt 91.6 kg    SpO2 100%    BMI  39.45 kg/m  Physical Exam Vitals and nursing note reviewed.  Constitutional:      Appearance: She is well-developed.  HENT:     Head: Normocephalic.  Eyes:     Extraocular Movements: Extraocular movements intact.  Cardiovascular:     Rate and Rhythm: Normal rate.  Pulmonary:     Effort: Pulmonary effort is normal.  Abdominal:     General: Abdomen is flat. Bowel sounds are normal. There is no distension.     Palpations: Abdomen is soft.     Tenderness: There is abdominal tenderness in the right upper quadrant and right lower quadrant.     Hernia: No hernia is present.  Musculoskeletal:        General: Normal range of motion.     Cervical back: Normal range of motion.  Skin:    General: Skin is  warm.  Neurological:     General: No focal deficit present.     Mental Status: She is alert and oriented to person, place, and time.    ED Results / Procedures / Treatments   Labs (all labs ordered are listed, but only abnormal results are displayed) Labs Reviewed  COMPREHENSIVE METABOLIC PANEL - Abnormal; Notable for the following components:      Result Value   CO2 21 (*)    Glucose, Bld 107 (*)    All other components within normal limits  CBC - Abnormal; Notable for the following components:   Hemoglobin 11.8 (*)    MCV 76.0 (*)    MCH 24.4 (*)    RDW 16.5 (*)    Platelets 419 (*)    All other components within normal limits  URINALYSIS, ROUTINE W REFLEX MICROSCOPIC - Abnormal; Notable for the following components:   Specific Gravity, Urine >1.030 (*)    All other components within normal limits  LIPASE, BLOOD  PREGNANCY, URINE    EKG None  Radiology CT ABDOMEN PELVIS WO CONTRAST  Result Date: 05/14/2021 CLINICAL DATA:  Abdomen pain EXAM: CT ABDOMEN AND PELVIS WITHOUT CONTRAST TECHNIQUE: Multidetector CT imaging of the abdomen and pelvis was performed following the standard protocol without IV contrast. RADIATION DOSE REDUCTION: This exam was performed according to the departmental dose-optimization program which includes automated exposure control, adjustment of the mA and/or kV according to patient size and/or use of iterative reconstruction technique. COMPARISON:  None. FINDINGS: Lower chest: No acute abnormality. Hepatobiliary: Hepatic steatosis. No calcified gallstone or biliary dilatation Pancreas: Unremarkable. No pancreatic ductal dilatation or surrounding inflammatory changes. Spleen: Normal in size without focal abnormality. Adrenals/Urinary Tract: Adrenal glands are unremarkable. Kidneys are normal, without renal calculi, focal lesion, or hydronephrosis. Bladder is unremarkable. Stomach/Bowel: Stomach is within normal limits. Appendix appears normal. No evidence of  bowel wall thickening, distention, or inflammatory changes. Vascular/Lymphatic: No significant vascular findings are present. No enlarged abdominal or pelvic lymph nodes. Reproductive: Probable small uterine fibroids.  No adnexal mass Other: No abdominal wall hernia or abnormality. No abdominopelvic ascites. Musculoskeletal: No acute or significant osseous findings. IMPRESSION: 1. No CT evidence for acute intra-abdominal or pelvic abnormality. 2. Hepatic steatosis 3. Probable small uterine fibroids Electronically Signed   By: Donavan Foil M.D.   On: 05/14/2021 16:52    Procedures Procedures    Medications Ordered in ED Medications - No data to display  ED Course/ Medical Decision Making/ A&P  Medical Decision Making Problems Addressed: Generalized abdominal pain: acute illness or injury Urinary tract infection without hematuria, site unspecified: self-limited or minor problem    Details: currently being treated  Amount and/or Complexity of Data Reviewed Independent Historian: parent Labs: ordered. Decision-making details documented in ED Course. Radiology:  Decision-making details documented in ED Course. Discussion of management or test interpretation with external provider(s): Pt advised to finish antibiotics.  Pt given rx for probiotic.  Pt advised tylenol for discomfort   Risk OTC drugs. Prescription drug management.   MDM:  Johnnye Lana shows uti has resolved.  Pt has a ormal urine.  CBC shows a normal wbc count.  Chemistries are normal  Ct abdomen and pelvis,  normal organs  noraml Ct          Final Clinical Impression(s) / ED Diagnoses Final diagnoses:  Generalized abdominal pain  Urinary tract infection without hematuria, site unspecified    Rx / DC Orders ED Discharge Orders          Ordered    Probiotic Product (CULTURELLE PROBIOTICS) CHEW        05/14/21 1824           An After Visit Summary was printed and given to the patient.     Fransico Meadow, Hershal Coria 05/14/21 1834    Drenda Freeze, MD 05/19/21 [phone]

## 2021-05-14 NOTE — Discharge Instructions (Signed)
Finish antibiotic.  Tylenol for discomfort

## 2021-07-25 ENCOUNTER — Other Ambulatory Visit: Payer: Self-pay

## 2021-07-25 ENCOUNTER — Encounter: Payer: Self-pay | Admitting: Nurse Practitioner

## 2021-07-25 ENCOUNTER — Ambulatory Visit (INDEPENDENT_AMBULATORY_CARE_PROVIDER_SITE_OTHER): Payer: 59 | Admitting: Nurse Practitioner

## 2021-07-25 VITALS — BP 138/90 | HR 85 | Temp 97.1°F | Wt 202.2 lb

## 2021-07-25 DIAGNOSIS — R030 Elevated blood-pressure reading, without diagnosis of hypertension: Secondary | ICD-10-CM

## 2021-07-25 DIAGNOSIS — K219 Gastro-esophageal reflux disease without esophagitis: Secondary | ICD-10-CM | POA: Diagnosis not present

## 2021-07-25 DIAGNOSIS — F419 Anxiety disorder, unspecified: Secondary | ICD-10-CM | POA: Diagnosis not present

## 2021-07-25 DIAGNOSIS — F411 Generalized anxiety disorder: Secondary | ICD-10-CM | POA: Insufficient documentation

## 2021-07-25 DIAGNOSIS — J4520 Mild intermittent asthma, uncomplicated: Secondary | ICD-10-CM

## 2021-07-25 DIAGNOSIS — J4521 Mild intermittent asthma with (acute) exacerbation: Secondary | ICD-10-CM | POA: Diagnosis not present

## 2021-07-25 MED ORDER — ALBUTEROL SULFATE HFA 108 (90 BASE) MCG/ACT IN AERS: 1.0000 | INHALATION_SPRAY | Freq: Four times a day (QID) | RESPIRATORY_TRACT | 0 refills | Status: DC | PRN

## 2021-07-25 MED ORDER — OMEPRAZOLE 20 MG PO CPDR: 20.0000 mg | DELAYED_RELEASE_CAPSULE | Freq: Every day | ORAL | 0 refills | Status: DC

## 2021-07-25 MED ORDER — SERTRALINE HCL 50 MG PO TABS: 50.0000 mg | ORAL_TABLET | Freq: Every day | ORAL | 3 refills | Status: DC

## 2021-07-25 NOTE — Assessment & Plan Note (Signed)
Chronic, not controlled.  PHQ-9 is a 7 and GAD-7 is a 9.  She states that she has never been diagnosed with anxiety before, however her symptoms have been worsening.  She is interested in starting medication and therapy.  Referral placed to psychiatry.  We will start sertraline 50 mg daily, start half a tablet daily for 7 days then increase to whole tablet.  Discussed possible side effects.  Denies SI/HI.  Follow-up in 4 to 6 weeks. ?

## 2021-07-25 NOTE — Progress Notes (Signed)
? ?Acute Office Visit ? ?Subjective:  ? ? Patient ID: Tara Joseph, female    DOB: 17-Jul-1990, 31 y.o.   MRN: [id number] ? ?Chief Complaint  ?Patient presents with  ? Anxiety  ?  Med review/refills. Pt c/o acid reflux w/ unusual mucous build up that causes a choking sensation at night  ? ? ?HPI ?Patient is in today for acid reflux and anxiety. ? ?She states that she woke up in the middle of the night with a choking sensation and sour taste in her throat. She does endorse some acid reflux during day. She states that coffee makes her symptoms worse, however it's sometimes hard to avoid this. She endorses shortness of breath with this episode as well. She states that last night she did eat late. Denies nausea and vomiting. She has tried tums, which hasn't helped as much.  ? ?She has history of asthma that is well controlled. She uses her albuterol inhaler as needed and states that she uses it 3 times a month.  She needs a refill on it today. ? ?She endorses anxiety symptoms, especially when in big crowds. She does note that she has been more irritable recently. She endorses a panic attack this past month. She denies depression symptoms, trouble sleeping. Denies SI/HI, self harm. When her anxiety worsens she tries to take deep breaths. She is interested in starting therapy.  ? ? ?  07/25/2021  ?  4:58 PM 02/14/2020  ?  9:43 AM 01/31/2018  ?  9:18 AM  ?Depression screen PHQ 2/9  ?Decreased Interest 2 0 0  ?Down, Depressed, Hopeless 0 0 0  ?PHQ - 2 Score 2 0 0  ?Altered sleeping 0    ?Tired, decreased energy 3    ?Change in appetite 0    ?Feeling bad or failure about yourself  0    ?Trouble concentrating 1    ?Moving slowly or fidgety/restless 1    ?Suicidal thoughts 0    ?PHQ-9 Score 7    ?Difficult doing work/chores Somewhat difficult    ? ? ?  07/25/2021  ?  4:59 PM  ?GAD 7 : Generalized Anxiety Score  ?Nervous, Anxious, on Edge 2  ?Control/stop worrying 1  ?Worry too much - different things 1  ?Trouble relaxing 1   ?Restless 2  ?Easily annoyed or irritable 2  ?Afraid - awful might happen 0  ?Total GAD 7 Score 9  ?Anxiety Difficulty Somewhat difficult  ? ? ?Past Medical History:  ?Diagnosis Date  ? Arthritis   ? Asthma   ? Migraines   ? Prediabetes   ? UTI (urinary tract infection)   ? ? ?History reviewed. No pertinent surgical history. ? ?Family History  ?Problem Relation Age of Onset  ? Hypertension Mother   ? Thyroid disease Mother   ? Diabetes Father   ? Hypertension Brother   ? Arthritis Maternal Grandmother   ? Hypertension Brother   ? ? ?Social History  ? ?Socioeconomic History  ? Marital status: Significant Other  ?  Spouse name: Not on file  ? Number of children: 0  ? Years of education: Not on file  ? Highest education level: Not on file  ?Occupational History  ? Not on file  ?Tobacco Use  ? Smoking status: Never  ? Smokeless tobacco: Never  ?Vaping Use  ? Vaping Use: Never used  ?Substance and Sexual Activity  ? Alcohol use: Not Currently  ? Drug use: Not Currently  ? Sexual activity: Never  ?  Birth control/protection: Abstinence  ?Other Topics Concern  ? Not on file  ?Social History Narrative  ? ** Merged History Encounter **  ?    ? ?Social Determinants of Health  ? ?Financial Resource Strain: Not on file  ?Food Insecurity: Not on file  ?Transportation Needs: Not on file  ?Physical Activity: Not on file  ?Stress: Not on file  ?Social Connections: Not on file  ?Intimate Partner Violence: Not on file  ? ? ?Outpatient Medications Prior to Visit  ?Medication Sig Dispense Refill  ? cetirizine (ZYRTEC) 10 MG tablet Take 1 tablet (10 mg total) by mouth at bedtime. (Patient not taking: Reported on 05/09/2021) 30 tablet 2  ? ibuprofen (ADVIL) 200 MG tablet Take 600-800 mg by mouth every 6 (six) hours as needed for headache or moderate pain.    ? Probiotic Product (CULTURELLE PROBIOTICS) CHEW Take one a day for 2 weeks 14 tablet 0  ? albuterol (VENTOLIN HFA) 108 (90 Base) MCG/ACT inhaler Inhale 1-2 puffs into the lungs every  6 (six) hours as needed for wheezing or shortness of breath. 8 g 0  ? beclomethasone (QVAR) 40 MCG/ACT inhaler Inhale 2 puffs into the lungs 2 (two) times daily. Rinse mouth after each use (Patient not taking: Reported on 05/09/2021) 1 each 2  ? cyclobenzaprine (FLEXERIL) 5 MG tablet Take 1 tablet (5 mg total) by mouth 2 (two) times daily as needed for muscle spasms. (Patient not taking: Reported on 05/09/2021) 10 tablet 0  ? dicyclomine (BENTYL) 20 MG tablet Take 1 tablet (20 mg total) by mouth 2 (two) times daily. 20 tablet 0  ? naproxen (NAPROSYN) 500 MG tablet Take 1 tablet (500 mg total) by mouth 2 (two) times daily. (Patient not taking: Reported on 05/09/2021) 30 tablet 0  ? ondansetron (ZOFRAN-ODT) 4 MG disintegrating tablet Take 1 tablet (4 mg total) by mouth every 8 (eight) hours as needed for nausea or vomiting. 20 tablet 0  ? ?No facility-administered medications prior to visit.  ? ? ?No Known Allergies ? ?Review of Systems  ?Constitutional:  Positive for fatigue.  ?HENT: Negative.    ?Respiratory: Negative.    ?Cardiovascular: Negative.   ?Gastrointestinal:  Positive for nausea. Negative for constipation, diarrhea and vomiting.  ?     Acid reflux  ?Genitourinary: Negative.   ?Neurological: Negative.   ?Psychiatric/Behavioral:  The patient is nervous/anxious.   ? ?   ?Objective:  ?  ?Physical Exam ?Vitals and nursing note reviewed.  ?Constitutional:   ?   General: She is not in acute distress. ?   Appearance: Normal appearance.  ?HENT:  ?   Head: Normocephalic.  ?Eyes:  ?   Conjunctiva/sclera: Conjunctivae normal.  ?Cardiovascular:  ?   Rate and Rhythm: Normal rate and regular rhythm.  ?   Pulses: Normal pulses.  ?   Heart sounds: Normal heart sounds.  ?Pulmonary:  ?   Effort: Pulmonary effort is normal.  ?   Breath sounds: Normal breath sounds.  ?Abdominal:  ?   Palpations: Abdomen is soft.  ?   Tenderness: There is no abdominal tenderness.  ?Musculoskeletal:  ?   Cervical back: Normal range of motion.  ?    Right lower leg: No edema.  ?   Left lower leg: No edema.  ?Skin: ?   General: Skin is warm.  ?Neurological:  ?   General: No focal deficit present.  ?   Mental Status: She is alert and oriented to person, place, and time.  ?Psychiatric:     ?  Mood and Affect: Mood normal.     ?   Behavior: Behavior normal.     ?   Thought Content: Thought content normal.     ?   Judgment: Judgment normal.  ? ? ?BP 138/90 (BP Location: Right Arm, Cuff Size: Large)   Pulse 85   Temp (!) 97.1 ?F (36.2 ?C) (Temporal)   Wt 202 lb 3.2 oz (91.7 kg)   SpO2 98%   BMI 39.49 kg/m?  ?Wt Readings from Last 3 Encounters:  ?07/25/21 202 lb 3.2 oz (91.7 kg)  ?05/14/21 202 lb (91.6 kg)  ?03/11/20 187 lb 3.2 oz (84.9 kg)  ? ? ?Health Maintenance Due  ?Topic Date Due  ? COVID-19 Vaccine (1) Never done  ? HIV Screening  Never done  ? Hepatitis C Screening  Never done  ? PAP-Cervical Cytology Screening  Never done  ? PAP SMEAR-Modifier  Never done  ? INFLUENZA VACCINE  12/02/2020  ? ? ?There are no preventive care reminders to display for this patient. ? ? ?Lab Results  ?Component Value Date  ? TSH 3.90 02/14/2020  ? ?Lab Results  ?Component Value Date  ? WBC 7.5 05/14/2021  ? HGB 11.8 (L) 05/14/2021  ? HCT 36.7 05/14/2021  ? MCV 76.0 (L) 05/14/2021  ? PLT 419 (H) 05/14/2021  ? ?Lab Results  ?Component Value Date  ? NA 135 05/14/2021  ? K 3.8 05/14/2021  ? CO2 21 (L) 05/14/2021  ? GLUCOSE 107 (H) 05/14/2021  ? BUN 15 05/14/2021  ? CREATININE 0.70 05/14/2021  ? BILITOT 0.3 05/14/2021  ? ALKPHOS 70 05/14/2021  ? AST 20 05/14/2021  ? ALT 23 05/14/2021  ? PROT 7.6 05/14/2021  ? ALBUMIN 4.0 05/14/2021  ? CALCIUM 9.3 05/14/2021  ? ANIONGAP 9 05/14/2021  ? GFR 122.01 02/14/2020  ? ?Lab Results  ?Component Value Date  ? CHOL 196 02/14/2020  ? ?Lab Results  ?Component Value Date  ? HDL 45.30 02/14/2020  ? ?Lab Results  ?Component Value Date  ? LDLCALC 126 (H) 02/14/2020  ? ?Lab Results  ?Component Value Date  ? TRIG 121.0 02/14/2020  ? ?Lab Results   ?Component Value Date  ? CHOLHDL 4 02/14/2020  ? ?Lab Results  ?Component Value Date  ? HGBA1C 5.8 02/14/2020  ? ? ?   ?Assessment & Plan:  ? ?Problem List Items Addressed This Visit   ? ?  ? Respiratory  ? Asthma  ?

## 2021-07-25 NOTE — Assessment & Plan Note (Signed)
Chronic, stable.  She uses her albuterol inhaler about 3 times a month.  We will send in refill for albuterol inhaler today. ?

## 2021-07-25 NOTE — Patient Instructions (Signed)
It was great to see you! ? ?Start omeprazole 1 capsule daily 30 minutes before food.  ? ?I have refilled your albuterol inhaler. ? ?I placed a referral to psychiatry. Start sertraline (zoloft) 1/2 daily 7 days, then 1 tablet daily.  ? ?Let's follow-up in 3 months, sooner if you have concerns. ? ?If a referral was placed today, you will be contacted for an appointment. Please note that routine referrals can sometimes take up to 3-4 weeks to process. Please call our office if you haven't heard anything after this time frame. ? ?Take care, ? ?Vance Peper, NP ? ?

## 2021-07-25 NOTE — Assessment & Plan Note (Signed)
Increasing episodes of acid reflux that are not controlled with Tums.  Discussed eating smaller more frequent meals, not eating and then laying down.  We will start omeprazole 20 mg daily.  Follow-up in 4 to 6 weeks. ?

## 2021-07-25 NOTE — Assessment & Plan Note (Signed)
Blood pressure elevated at 138/90.  Discussed that she should monitor her blood pressure at home to see how it is on a routine basis.  Also discussed limiting salt in her diet and increasing exercise.  Blood pressure could be up due to anxiety today.  Follow-up in 4 to 6 weeks. ?

## 2021-08-17 ENCOUNTER — Other Ambulatory Visit: Payer: Self-pay | Admitting: Nurse Practitioner

## 2021-08-28 ENCOUNTER — Encounter: Payer: Self-pay | Admitting: Nurse Practitioner

## 2021-08-28 ENCOUNTER — Ambulatory Visit (INDEPENDENT_AMBULATORY_CARE_PROVIDER_SITE_OTHER): Payer: 59 | Admitting: Nurse Practitioner

## 2021-08-28 VITALS — BP 122/86 | HR 92 | Temp 98.0°F | Ht 60.0 in | Wt 202.2 lb

## 2021-08-28 DIAGNOSIS — N920 Excessive and frequent menstruation with regular cycle: Secondary | ICD-10-CM

## 2021-08-28 DIAGNOSIS — Z6839 Body mass index (BMI) 39.0-39.9, adult: Secondary | ICD-10-CM

## 2021-08-28 DIAGNOSIS — F411 Generalized anxiety disorder: Secondary | ICD-10-CM

## 2021-08-28 DIAGNOSIS — F419 Anxiety disorder, unspecified: Secondary | ICD-10-CM | POA: Diagnosis not present

## 2021-08-28 DIAGNOSIS — Z30011 Encounter for initial prescription of contraceptive pills: Secondary | ICD-10-CM | POA: Diagnosis not present

## 2021-08-28 MED ORDER — NORETHIN ACE-ETH ESTRAD-FE 1-20 MG-MCG PO TABS
1.0000 | ORAL_TABLET | Freq: Every day | ORAL | 3 refills | Status: DC
Start: 2021-08-28 — End: 2024-01-17

## 2021-08-28 MED ORDER — SERTRALINE HCL 25 MG PO TABS: 25.0000 mg | ORAL_TABLET | Freq: Every day | ORAL | 5 refills | Status: DC

## 2021-08-28 NOTE — Patient Instructions (Signed)
Start Sertraline 1/2 tablet once daily for 1 week and then increase to a full tablet once daily continuously. Take with food. ?We discussed common side effects such as nausea, drowsiness and weight gain.  Also discussed rare but serious side effect of suicide ideation.  She is instructed to discontinue medication go directly to ED if this occurs.  Pt verbalizes understanding.   ?Plan follow up in 1 month to evaluate progress.   ? ?Start Junel-Fe (contraception). Take with food. ?Start prenatal multivitamin with iron (1tab daily with food). ? ?Oral Contraception Information ?Oral contraceptive pills (OCPs) are medicines taken by mouth to prevent pregnancy. They work by: ?Preventing the ovaries from releasing eggs. ?Thickening mucus in the lower part of the uterus (cervix). This prevents sperm from entering the uterus. ?Thinning the lining of the uterus (endometrium). This prevents a fertilized egg from attaching to the endometrium. ?OCPs are highly effective when taken exactly as prescribed. However, OCPs do not prevent STIs (sexually transmitted infections). Using condoms while on an OCP can help prevent STIs. ?What happens before starting OCPs? ?Before you start taking OCPs: ?You may have a physical exam, blood test, and Pap test. ?Your health care provider will make sure you are a good candidate for oral contraception. OCPs are not a good option for certain women, such as: ?Women who smoke and are older than age 64. ?Women who have or have had certain conditions, such as: ?A history of high blood pressure. ?Deep vein thrombosis. ?Pulmonary embolism. ?Stroke. ?Cardiovascular disease. ?Peripheral vascular disease. ?Ask your health care provider about the possible side effects of the OCP you may be prescribed. Be aware that it can take 2-3 months for your body to adjust to changes in hormone levels. ?Types of oral contraception ? ?Birth control pills contain the hormones estrogen and progestin (synthetic  progesterone) or progestin only. ?The combination pill ?This type of pill contains estrogen and progestin hormones. ?Conventional contraception pills come in packs of 21 or 28 pills. ?Some packs with 28-day pills contain estrogen and progestin for the first 21-24 days. Hormone-free tablets, called placebos, are taken for the final 4-7 days. You should have menstrual bleeding during the time you take the placebos. ?In packs with 21 tablets, you take no pills for 7 days. Menstrual bleeding occurs during these days. (Some people prefer taking a pill for 28 days to help establish a routine). ?Extended-interval contraception pills come in packs of 91 pills. The first 84 tablets have both estrogen and progestin. The last 7 pills are placebos. Menstrual bleeding occurs during the placebo days. With this schedule, menstrual bleeding happens once every 3 months. ?Continuous contraception pills come in packs of 28 pills. All pills in the pack contain estrogen and progestin. With this schedule, regular menstrual bleeding does not happen, but there may be spotting or irregular bleeding. ?Progestin-only pills ?This type of pill is often called the mini-pill and contains the progestin hormone only. It comes in packs of 28 pills. In some packs, the last 4 pills are placebos. The pill must be taken at the same time every day. This is very important to prevent pregnancy. Menstrual bleeding may not be regular or predictable. ?What are the advantages? ?Oral contraception provides reliable and continuous contraception if taken as directed. It may treat or decrease symptoms of: ?Menstrual period cramps. ?Irregular menstrual cycle or bleeding. ?Heavy menstrual flow. ?Abnormal uterine bleeding. ?Acne, depending on the type of pill. ?Polycystic ovarian syndrome (POS). ?Endometriosis. ?Iron deficiency anemia. ?Premenstrual symptoms, including severe irritability,  depression, or anxiety. ?It also may: ?Reduce the risk of endometrial and  ovarian cancer. ?Be used as emergency contraception. ?Prevent ectopic pregnancies and infections of the fallopian tubes. ?What can make OCPs less effective? ?OCPs may be less effective if: ?You forget to take the pill every day. For progestin-only pills, it is especially important to take the pill at the same time each day. Even taking it 3 hours late can increase the risk of pregnancy. ?You have a stomach or intestinal disease that reduces your body's ability to absorb the pill. ?You take OCPs with other medicines that make OCPs less effective, such as antibiotics, certain HIV medicines, and some seizure medicines. ?You take expired OCPs. ?You forget to restart the pill after 7 days of not taking it. This refers to the packs of 21 pills. ?What are the side effects and risks? ?OCPs can sometimes cause side effects, such as: ?Headache. ?Depression. ?Trouble sleeping. ?Nausea and vomiting. ?Breast tenderness. ?Irregular bleeding or spotting during the first several months. ?Bloating or fluid retention. ?Increase in blood pressure. ?Combination pills may slightly increase the risk of: ?Blood clots. ?Heart attack. ?Stroke. ?Follow these instructions at home: ?Follow instructions from your health care provider about how to start taking your first cycle of OCPs. Depending on when you start the pill, you may need to use a backup form of birth control, such as condoms, during the first week. Make sure you know what steps to take if you forget to take the pill. ?Summary ?Oral contraceptive pills (OCPs) are medicines taken by mouth to prevent pregnancy. They are highly effective when taken exactly as prescribed. ?OCPs contain a combination of the hormones estrogen and progestin (synthetic progesterone) or progestin only. ?Before you start taking the pill, you may have a physical exam, blood test, and Pap test. Your health care provider will make sure you are a good candidate for oral contraception. ?The combination pill may  come in a 21-day pack, a 28-day pack, or a 91-day pack. Progestin-only pills come in packs of 28 pills. ?OCPs can sometimes cause side effects, such as headache, nausea, breast tenderness, or irregular bleeding. ?This information is not intended to replace advice given to you by your health care provider. Make sure you discuss any questions you have with your health care provider. ?Document Revised: 01/19/2020 Document Reviewed: 12/28/2019 ?Elsevier Patient Education ? [place]. ? ?  ?

## 2021-08-28 NOTE — Assessment & Plan Note (Signed)
No change in mood. Did not start zoloft as prescribed due to fear of possible side effects. Reports fluctuating mood which affects her relationship with her fiancee. ?We discussed possible med side effects vs benefits of zoloft.  ?Advised to start at low dose, 25mg  half tab daily. ?She also agreed to psychology referral. ?F/up in 67month ?

## 2021-08-28 NOTE — Assessment & Plan Note (Signed)
Abstinence, virgin. ?Monthly menstrual cycle, 5-7days bleeding. ?Reports heavy bleeding, overnight pads, changes every 2-3hrs x 2days, has large clots and cramping. ?No Fhx of utreine fibroids or endometriosis. ?No Fhx of DVT/PE. ?No tobacco use ?No previous PAP smear ?No FHx of cervical or breast or ovarian cancer. ? ?We dicussed the different contraception methods, their pros and cons. She opted to start COC-Junel Fe ?Advised to also start prenatal MVi with iron. ? ?

## 2021-08-28 NOTE — Progress Notes (Signed)
? ?             Established Patient Visit ? ?Patient: Tara Joseph   DOB: 1991/04/21   30 y.o. Female  MRN: [id number] ?Visit Date: 08/28/2021 ? ?Subjective:  ?  ?Chief Complaint  ?Patient presents with  ? Follow-up  ?  1 month f/u on anxiety.  ?Pt states she never took the medication due to concerns regarding the possible side effects and would like to discuss this with the provider today.   ? ?HPI ?Anxiety ?No change in mood. Did not start zoloft as prescribed due to fear of possible side effects. Reports fluctuating mood which affects her relationship with her fiancee. ?We discussed possible med side effects vs benefits of zoloft.  ?Advised to start at low dose, 25mg  half tab daily. ?She also agreed to psychology referral. ?F/up in 1month ? ?Menorrhagia with regular cycle ?Abstinence, virgin. ?Monthly menstrual cycle, 5-7days bleeding. ?Reports heavy bleeding, overnight pads, changes every 2-3hrs x 2days, has large clots and cramping. ?No Fhx of utreine fibroids or endometriosis. ?No Fhx of DVT/PE. ?No tobacco use ?No previous PAP smear ?No FHx of cervical or breast or ovarian cancer. ? ?We dicussed the different contraception methods, their pros and cons. She opted to start COC-Junel Fe ?Advised to also start prenatal MVi with iron. ? ?Wt Readings from Last 3 Encounters:  ?08/28/21 202 lb 3.2 oz (91.7 kg)  ?07/25/21 202 lb 3.2 oz (91.7 kg)  ?05/14/21 202 lb (91.6 kg)  ?  ?Reviewed medical, surgical, and social history today ? ?Medications: ?Outpatient Medications Prior to Visit  ?Medication Sig  ? albuterol (VENTOLIN HFA) 108 (90 Base) MCG/ACT inhaler Inhale 1-2 puffs into the lungs every 6 (six) hours as needed for wheezing or shortness of breath.  ? omeprazole (PRILOSEC) 20 MG capsule Take 1 capsule (20 mg total) by mouth daily.  ? Probiotic Product (CULTURELLE PROBIOTICS) CHEW Take one a day for 2 weeks  ? [DISCONTINUED] ibuprofen (ADVIL) 200 MG tablet Take 600-800 mg by mouth every 6 (six) hours as needed  for headache or moderate pain.  ? [DISCONTINUED] sertraline (ZOLOFT) 50 MG tablet Take 1 tablet (50 mg total) by mouth daily. Start 1/2 tablet daily for 7 days, then increase to 1 tablet daily  ? [DISCONTINUED] cetirizine (ZYRTEC) 10 MG tablet Take 1 tablet (10 mg total) by mouth at bedtime. (Patient not taking: Reported on 05/09/2021)  ? ?No facility-administered medications prior to visit.  ? ?Reviewed past medical and social history.  ? ?ROS per HPI above ? ? ?   ?Objective:  ?BP 122/86 (BP Location: Left Arm, Patient Position: Sitting, Cuff Size: Large)   Pulse 92   Temp 98 ?F (36.7 ?C) (Temporal)   Ht 5' (1.524 m)   Wt 202 lb 3.2 oz (91.7 kg)   LMP 08/25/2021 (Exact Date)   SpO2 99%   BMI 39.49 kg/m?  ? ?  ? ?Physical Exam ?Vitals reviewed.  ?Constitutional:   ?   Appearance: She is obese.  ?Cardiovascular:  ?   Rate and Rhythm: Normal rate.  ?   Pulses: Normal pulses.  ?Neurological:  ?   Mental Status: She is alert.  ?  ?No results found for any visits on 08/28/21. ?   ?Assessment & Plan:  ?  ?Problem List Items Addressed This Visit   ? ?  ? Other  ? Anxiety  ?  No change in mood. Did not start zoloft as prescribed due to fear of possible side effects. Reports  fluctuating mood which affects her relationship with her fiancee. ?We discussed possible med side effects vs benefits of zoloft.  ?Advised to start at low dose, 25mg  half tab daily. ?She also agreed to psychology referral. ?F/up in 63month ? ?  ?  ? Relevant Medications  ? sertraline (ZOLOFT) 25 MG tablet  ? Menorrhagia with regular cycle  ?  Abstinence, virgin. ?Monthly menstrual cycle, 5-7days bleeding. ?Reports heavy bleeding, overnight pads, changes every 2-3hrs x 2days, has large clots and cramping. ?No Fhx of utreine fibroids or endometriosis. ?No Fhx of DVT/PE. ?No tobacco use ?No previous PAP smear ?No FHx of cervical or breast or ovarian cancer. ? ?We dicussed the different contraception methods, their pros and cons. She opted to start  COC-Junel Fe ?Advised to also start prenatal MVi with iron. ? ?  ?  ? ?Other Visit Diagnoses   ? ? GAD (generalized anxiety disorder)    -  Primary  ? Relevant Medications  ? sertraline (ZOLOFT) 25 MG tablet  ? Other Relevant Orders  ? Ambulatory referral to Psychology  ? Encounter for initial prescription of contraceptive pills      ? Relevant Medications  ? norethindrone-ethinyl estradiol-FE (JUNEL FE 1/20) 1-20 MG-MCG tablet  ? ?  ? ?Return in about 4 weeks (around 09/25/2021) for Anemia, anxiety, menorrhagia. ? ?  ? ?09/27/2021, NP ? ? ?

## 2021-09-08 ENCOUNTER — Ambulatory Visit: Payer: 59 | Admitting: Nurse Practitioner

## 2021-09-08 ENCOUNTER — Telehealth: Payer: Self-pay | Admitting: Nurse Practitioner

## 2021-09-08 NOTE — Telephone Encounter (Signed)
Pt was a no show for her OV with Baldo Ash on 09/08/21, I sent a no show letter.  ?

## 2021-09-08 NOTE — Telephone Encounter (Signed)
Pt said she thinks the [facility] she started taking is causing her to urinate blood clots and she is having bleeding like she is on her period. She is on her way to the ED or urgent care. She wants to know if you think she should stop taking them. ?

## 2021-09-11 NOTE — Telephone Encounter (Signed)
2nd no show, fee generated 

## 2021-09-22 ENCOUNTER — Other Ambulatory Visit: Payer: Self-pay | Admitting: Nurse Practitioner

## 2021-09-22 DIAGNOSIS — F411 Generalized anxiety disorder: Secondary | ICD-10-CM

## 2021-09-22 NOTE — Telephone Encounter (Signed)
F/u appt scheduled

## 2021-09-25 ENCOUNTER — Ambulatory Visit: Payer: 59 | Admitting: Nurse Practitioner

## 2021-10-05 ENCOUNTER — Other Ambulatory Visit: Payer: Self-pay | Admitting: Nurse Practitioner

## 2021-10-05 DIAGNOSIS — F411 Generalized anxiety disorder: Secondary | ICD-10-CM

## 2021-10-29 ENCOUNTER — Telehealth (HOSPITAL_COMMUNITY): Payer: Self-pay | Admitting: Psychiatry

## 2021-11-05 ENCOUNTER — Ambulatory Visit: Payer: 59 | Admitting: Nurse Practitioner

## 2022-06-02 IMAGING — DX DG FOOT COMPLETE 3+V*L*
3 series · 3 of 3 positions shown · non-contrast
Comparison: None.

CLINICAL DATA: Great toe pain and fourth toe pain after injury

EXAM:
LEFT FOOT - COMPLETE 3+ VIEW

[foot ap]
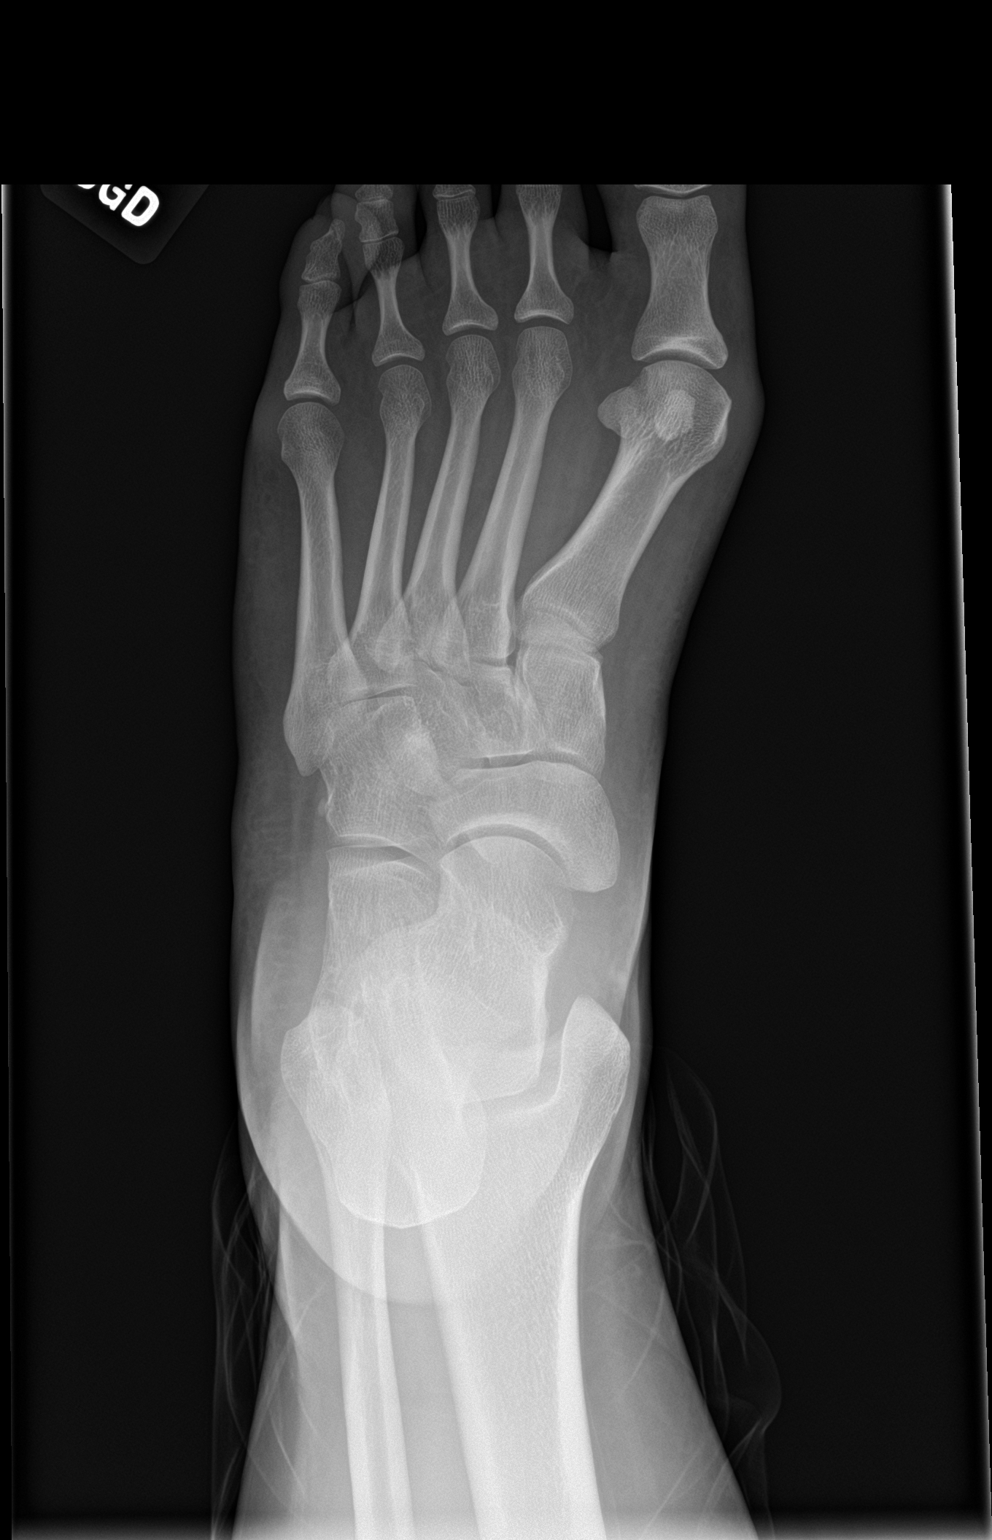

[foot obl]
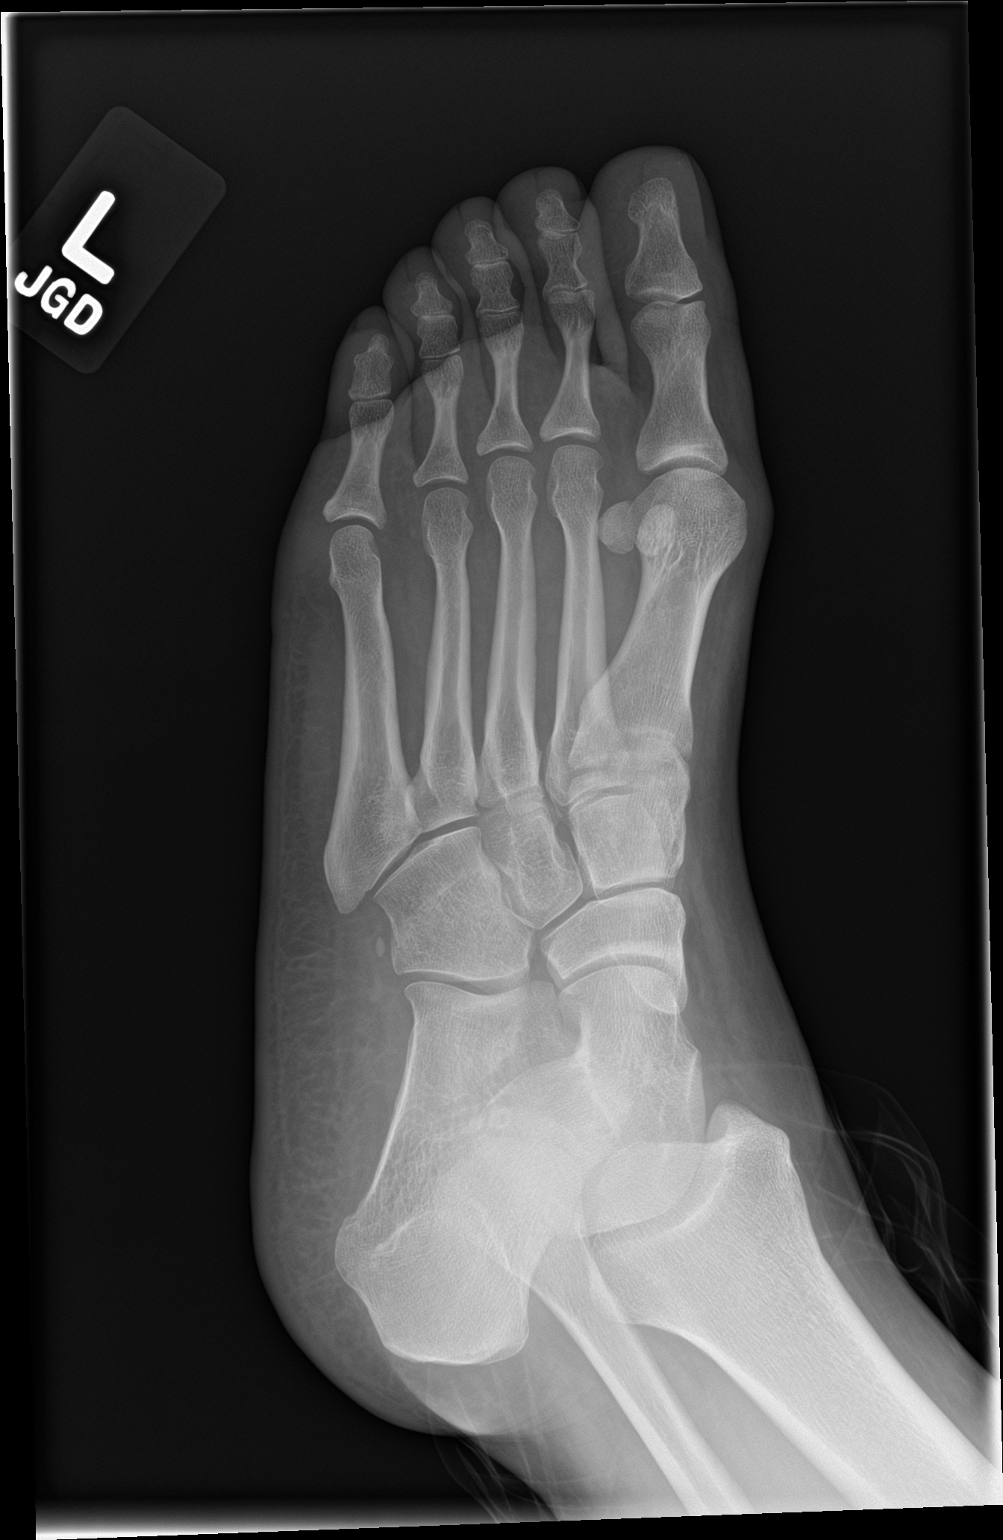

[foot lat]
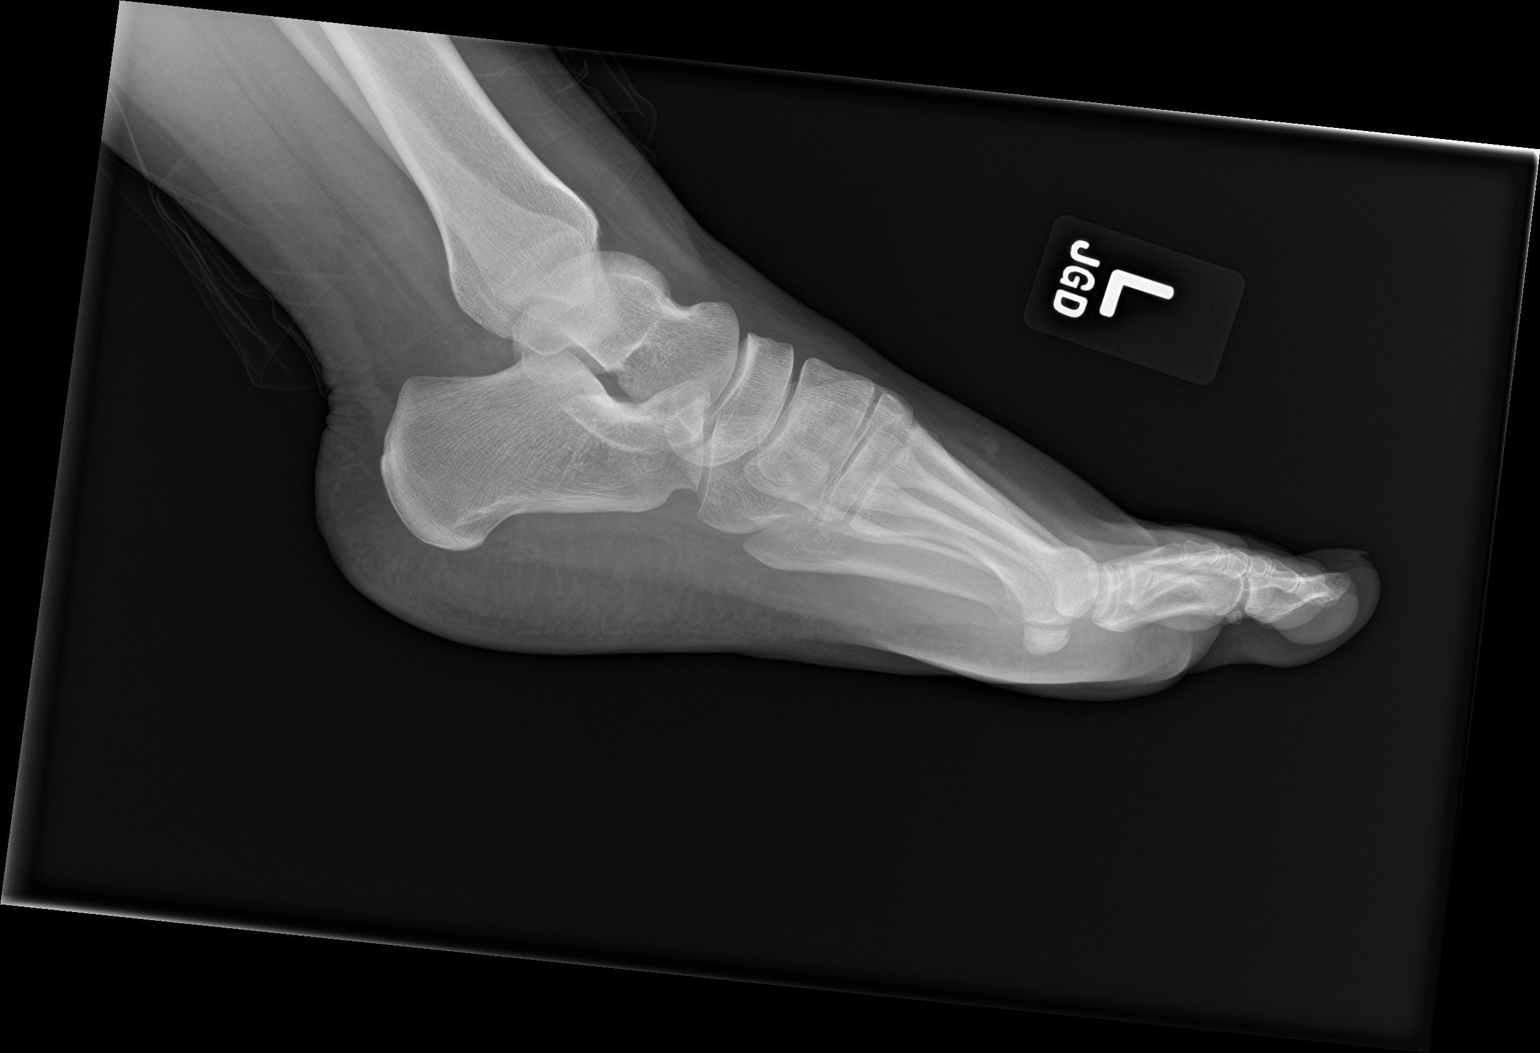

[3 of 3 positions shown; findings below may reference images not displayed]

FINDINGS: There is no evidence of fracture or dislocation. Hallux valgus
alignment with preservation of the first MTP joint space. There is
no evidence of arthropathy or other focal bone abnormality. Soft
tissues are unremarkable.
IMPRESSION: Negative.

## 2023-06-10 ENCOUNTER — Other Ambulatory Visit: Payer: Self-pay

## 2023-06-10 ENCOUNTER — Emergency Department (HOSPITAL_COMMUNITY)
Admission: EM | Admit: 2023-06-10 | Discharge: 2023-06-11 | Disposition: A | Discharge status: Home / Self Care | Payer: Self-pay | Attending: Student | Admitting: Student

## 2023-06-10 ENCOUNTER — Emergency Department (HOSPITAL_COMMUNITY): Payer: Self-pay

## 2023-06-10 DIAGNOSIS — J45909 Unspecified asthma, uncomplicated: Secondary | ICD-10-CM | POA: Insufficient documentation

## 2023-06-10 DIAGNOSIS — J029 Acute pharyngitis, unspecified: Secondary | ICD-10-CM | POA: Diagnosis present

## 2023-06-10 LAB — COMPREHENSIVE METABOLIC PANEL
ALT: 18 U/L (ref 0–44)
AST: 27 U/L (ref 15–41)
Albumin: 4.1 g/dL (ref 3.5–5.0)
Alkaline Phosphatase: 77 U/L (ref 38–126)
Anion gap: 9 (ref 5–15)
BUN: 15 mg/dL (ref 6–20)
CO2: 23 mmol/L (ref 22–32)
Calcium: 9.7 mg/dL (ref 8.9–10.3)
Chloride: 106 mmol/L (ref 98–111)
Creatinine, Ser: 0.82 mg/dL (ref 0.44–1.00)
GFR, Estimated: >60 mL/min (ref >60)
Glucose, Bld: 92 mg/dL (ref 70–99)
Potassium: 3.8 mmol/L (ref 3.5–5.1)
Sodium: 138 mmol/L (ref 135–145)
Total Bilirubin: 0.7 mg/dL (ref 0.0–1.2)
Total Protein: 7.2 g/dL (ref 6.5–8.1)

## 2023-06-10 LAB — CBC WITH DIFFERENTIAL/PLATELET
Abs Immature Granulocytes: 0.02 10*3/uL (ref 0.00–0.07)
Basophils Absolute: 0.1 10*3/uL (ref 0.0–0.1)
Basophils Relative: 1 %
Eosinophils Absolute: 0.2 10*3/uL (ref 0.0–0.5)
Eosinophils Relative: 2 %
HCT: 40.3 % (ref 36.0–46.0)
Hemoglobin: 12.9 g/dL (ref 12.0–15.0)
Immature Granulocytes: 0 %
Lymphocytes Relative: 27 %
Lymphs Abs: 2.5 10*3/uL (ref 0.7–4.0)
MCH: 25.9 pg — ABNORMAL LOW (ref 26.0–34.0)
MCHC: 32 g/dL (ref 30.0–36.0)
MCV: 80.8 fL (ref 80.0–100.0)
Monocytes Absolute: 0.7 10*3/uL (ref 0.1–1.0)
Monocytes Relative: 8 %
Neutro Abs: 5.8 10*3/uL (ref 1.7–7.7)
Neutrophils Relative %: 62 %
Platelets: 370 10*3/uL (ref 150–400)
RBC: 4.99 MIL/uL (ref 3.87–5.11)
RDW: 16.7 % — ABNORMAL HIGH (ref 11.5–15.5)
WBC: 9.3 10*3/uL (ref 4.0–10.5)
nRBC: 0 % (ref 0.0–0.2)

## 2023-06-10 MED ORDER — LIDOCAINE VISCOUS HCL 2 % MT SOLN
15.0000 mL | Freq: Once | OROMUCOSAL | Status: AC
  Administered 2023-06-10: 15 mL via ORAL
  Filled 2023-06-10: qty 15

## 2023-06-10 MED ORDER — DEXAMETHASONE 10 MG/ML FOR PEDIATRIC ORAL USE
10.0000 mg | Freq: Once | INTRAMUSCULAR | Status: AC
  Administered 2023-06-10: 10 mg via ORAL
  Filled 2023-06-10: qty 1

## 2023-06-10 MED ORDER — ALUM & MAG HYDROXIDE-SIMETH 200-200-20 MG/5ML PO SUSP
30.0000 mL | Freq: Once | ORAL | Status: AC
  Administered 2023-06-10: 30 mL via ORAL
  Filled 2023-06-10: qty 30

## 2023-06-10 NOTE — ED Triage Notes (Signed)
 Patient reports sudden onset throat discomfort/tightness' burning"  after eating strawberries with yogurt this morning .

## 2023-06-10 NOTE — ED Provider Notes (Signed)
 Received patient in turnover from Dr. Kommor.  Please see their note for further details of Hx, PE.  Briefly patient is a 33 y.o. female with a Allergic Reaction .  Patient with what sounds like reflux, plan for CT imaging of the soft tissue neck.  If negative recommend treatment for reflux and PCP follow-up.  CT scan with read as possible pharyngitis.  Patient tells me that she did have some nasal congestion on repeat exam.  Will treat as possible bacterial pharyngitis.  She is already given Decadron .  Feeling better.  PCP follow-up.   Emil Share, DO 06/11/23 [phone]

## 2023-06-11 MED ORDER — AMOXICILLIN 500 MG PO CAPS: 1000.0000 mg | ORAL_CAPSULE | Freq: Two times a day (BID) | ORAL | 0 refills | Status: DC

## 2023-06-11 MED ORDER — IOHEXOL 350 MG/ML SOLN
75.0000 mL | Freq: Once | INTRAVENOUS | Status: AC | PRN
  Administered 2023-06-11: 75 mL via INTRAVENOUS

## 2023-06-11 NOTE — ED Provider Notes (Signed)
 [facility] Provider Note  CSN: [id number] Arrival date & time: 06/10/23 2041  Chief Complaint(s) Allergic Reaction  HPI Tara Joseph is a 33 y.o. female with PMH asthma, migraines, obesity who presents emergency department for evaluation of throat tightness and sore throat.  Chief complaint states allergic reaction but patient's story is likely not consistent with this chief complaint.  She states that she had a sudden onset episode of burning and tightness in her throat.  No associated rash, pruritus, shortness of breath, wheezing or facial swelling.  She states that she has had episodes like this in the past that generally worsen at night after a big meal and were worse before she had lost a significant amount of weight.  Does not take any medication for GERD at home.   Past Medical History Past Medical History:  Diagnosis Date   Arthritis    Asthma    Migraines    Prediabetes    UTI (urinary tract infection)    Patient Active Problem List   Diagnosis Date Noted   Menorrhagia with regular cycle 08/28/2021   Gastroesophageal reflux disease 07/25/2021   Anxiety 07/25/2021   Elevated blood pressure reading 07/25/2021   Class 2 severe obesity due to excess calories with serious comorbidity and body mass index (BMI) of 35.0 to 35.9 in adult [facility]) 02/16/2020   Acanthosis nigricans 02/16/2020   Asthma 01/11/2020   Home Medication(s) Prior to Admission medications   Medication Sig Start Date End Date Taking? Authorizing Provider  amoxicillin  (AMOXIL ) 500 MG capsule Take 2 capsules (1,000 mg total) by mouth 2 (two) times daily. 06/11/23  Yes Emil Share, DO  albuterol  (VENTOLIN  HFA) [phone] Base) MCG/ACT inhaler Inhale 1-2 puffs into the lungs every 6 (six) hours as needed for wheezing or shortness of breath. 07/25/21   McElwee, Tinnie LABOR, NP  norethindrone -ethinyl estradiol -FE (JUNEL FE 1/20) 1-20 MG-MCG tablet Take 1 tablet by mouth daily.  Skips placebo pills 08/28/21   Nche, Roselie Rockford, NP  omeprazole  (PRILOSEC) 20 MG capsule Take 1 capsule (20 mg total) by mouth daily. 07/25/21   McElwee, Tinnie LABOR, NP  Probiotic Product (CULTURELLE PROBIOTICS) CHEW Take one a day for 2 weeks 05/14/21   Flint Sonny POUR, PA-C  sertraline  (ZOLOFT ) 25 MG tablet TAKE 1 TABLET (25 MG TOTAL) BY MOUTH DAILY. 09/22/21   Nche, Roselie Rockford, NP                                                                                                                                    Past Surgical History No past surgical history on file. Family History Family History  Problem Relation Age of Onset   Hypertension Mother    Thyroid  disease Mother    Diabetes Father    Hypertension Brother    Arthritis Maternal Grandmother    Hypertension Brother     Social History Social History  Tobacco Use   Smoking status: Never   Smokeless tobacco: Never  Vaping Use   Vaping status: Never Used  Substance Use Topics   Alcohol use: Not Currently   Drug use: Not Currently   Allergies Patient has no known allergies.  Review of Systems Review of Systems  HENT:  Positive for sore throat.     Physical Exam Vital Signs  I have reviewed the triage vital signs BP (!) 154/98 (BP Location: Right Arm)   Pulse 82   Temp 98.7 F (37.1 C) (Oral)   Resp (!) 21   SpO2 100%   Physical Exam Vitals and nursing note reviewed.  Constitutional:      General: She is not in acute distress.    Appearance: She is well-developed.  HENT:     Head: Normocephalic and atraumatic.  Eyes:     Conjunctiva/sclera: Conjunctivae normal.     Comments: Mild tonsillar swelling  Cardiovascular:     Rate and Rhythm: Normal rate and regular rhythm.     Heart sounds: No murmur heard. Pulmonary:     Effort: Pulmonary effort is normal. No respiratory distress.     Breath sounds: Normal breath sounds.  Abdominal:     Palpations: Abdomen is soft.     Tenderness: There is no abdominal  tenderness.  Musculoskeletal:        General: No swelling.     Cervical back: Neck supple.  Skin:    General: Skin is warm and dry.     Capillary Refill: Capillary refill takes less than 2 seconds.  Neurological:     Mental Status: She is alert.  Psychiatric:        Mood and Affect: Mood normal.     ED Results and Treatments Labs (all labs ordered are listed, but only abnormal results are displayed) Labs Reviewed  CBC WITH DIFFERENTIAL/PLATELET - Abnormal; Notable for the following components:      Result Value   MCH 25.9 (*)    RDW 16.7 (*)    All other components within normal limits  COMPREHENSIVE METABOLIC PANEL                                                                                                                          Radiology CT Soft Tissue Neck W Contrast Result Date: 06/11/2023 CLINICAL DATA:  Initial evaluation for acute sore throat, suspected epiglottitis or tonsillitis. EXAM: CT NECK WITH CONTRAST TECHNIQUE: Multidetector CT imaging of the neck was performed using the standard protocol following the bolus administration of intravenous contrast. RADIATION DOSE REDUCTION: This exam was performed according to the departmental dose-optimization program which includes automated exposure control, adjustment of the mA and/or kV according to patient size and/or use of iterative reconstruction technique. CONTRAST:  75mL OMNIPAQUE  IOHEXOL  350 MG/ML SOLN COMPARISON:  None Available. FINDINGS: Pharynx and larynx: Oral cavity within normal limits. Palatine tonsils are mildly prominent and hypertrophied, suggesting possible mild changes of acute tonsillitis. No discrete tonsillar  or peritonsillar abscess. Minimal induration within the adjacent parapharyngeal fat is suspected. Mild mucosal edema within the adjacent oropharynx, suggesting a degree of concomitant pharyngitis. Nasopharynx within normal limits. No retropharyngeal collection or swelling. Epiglottis within normal  limits. Hypopharynx and supraglottic larynx within normal limits. Negative glottis. Subglottic airway patent clear. Salivary glands: Salivary glands including the parotid and submandibular glands are within normal limits. Thyroid : Normal. Lymph nodes: No enlarged or pathologic adenopathy within the neck. Vascular: Normal intravascular enhancement seen within the neck. Limited intracranial: Unremarkable. Visualized orbits: Unremarkable. Mastoids and visualized paranasal sinuses: Visualized paranasal sinuses are clear. Visualized mastoids and middle ear cavities are well pneumatized and free of fluid. Skeleton: No discrete or worrisome osseous lesions. Upper chest: No other acute finding. Other: None. IMPRESSION: Question mild changes of acute tonsillitis/pharyngitis as above. Correlation with physical exam recommended. No discrete tonsillar or peritonsillar abscess. Electronically Signed   By: Morene Hoard M.D.   On: 06/11/2023 00:23    Pertinent labs & imaging results that were available during my care of the patient were reviewed by me and considered in my medical decision making (see MDM for details).  Medications Ordered in ED Medications  alum & mag hydroxide-simeth (MAALOX/MYLANTA) 200-200-20 MG/5ML suspension 30 mL (30 mLs Oral Given 06/10/23 2254)    And  lidocaine  (XYLOCAINE ) 2 % viscous mouth solution 15 mL (15 mLs Oral Given 06/10/23 2254)  dexamethasone  (DECADRON ) 10 MG/ML injection for Pediatric ORAL use 10 mg (10 mg Oral Given 06/10/23 2307)  iohexol  (OMNIPAQUE ) 350 MG/ML injection 75 mL (75 mLs Intravenous Contrast Given 06/11/23 0002)                                                                                                                                     Procedures Procedures  (including critical care time)  Medical Decision Making / ED Course   This patient presents to the ED for concern of sore throat, this involves an extensive number of treatment options, and is a  complaint that carries with it a high risk of complications and morbidity.  The differential diagnosis includes GERD with reflux, LPR, pharyngitis, mass, angioedema, allergic reaction  MDM: Patient seen emergency room for evaluation of sore throat and swelling.  Physical exam with some mild tonsillar swelling but is otherwise unremarkable.  No rash, no urticaria, no wheezing or any signs of a histamine based reaction seen on physical exam.  Patient given oral Decadron  and a GI cocktail with significant improvement of her symptoms.  Laboratory evaluation is unremarkable.  At time of signout, patient pending CT soft tissue neck.  Please see provider signout note for continuation of workup.  Do suspect patient presentation likely secondary to reflux.   Additional history obtained: -Additional history obtained from significant other -External records from outside source obtained and reviewed including: Chart review including previous notes, labs, imaging, consultation notes   Lab Tests: -I ordered, reviewed, and  interpreted labs.   The pertinent results include:   Labs Reviewed  CBC WITH DIFFERENTIAL/PLATELET - Abnormal; Notable for the following components:      Result Value   MCH 25.9 (*)    RDW 16.7 (*)    All other components within normal limits  COMPREHENSIVE METABOLIC PANEL       Imaging Studies ordered: I ordered imaging studies including CT soft tissue neck and this is pending   Medicines ordered and prescription drug management: Meds ordered this encounter  Medications   AND Linked Order Group    alum & mag hydroxide-simeth (MAALOX/MYLANTA) 200-200-20 MG/5ML suspension 30 mL    lidocaine  (XYLOCAINE ) 2 % viscous mouth solution 15 mL   dexamethasone  (DECADRON ) 10 MG/ML injection for Pediatric ORAL use 10 mg   iohexol  (OMNIPAQUE ) 350 MG/ML injection 75 mL   amoxicillin  (AMOXIL ) 500 MG capsule    Sig: Take 2 capsules (1,000 mg total) by mouth 2 (two) times daily.    Dispense:   40 capsule    Refill:  0    -I have reviewed the patients home medicines and have made adjustments as needed  Critical interventions none    Cardiac Monitoring: The patient was maintained on a cardiac monitor.  I personally viewed and interpreted the cardiac monitored which showed an underlying rhythm of: NSR  Social Determinants of Health:  Factors impacting patients care include: none   Reevaluation: After the interventions noted above, I reevaluated the patient and found that they have :improved  Co morbidities that complicate the patient evaluation  Past Medical History:  Diagnosis Date   Arthritis    Asthma    Migraines    Prediabetes    UTI (urinary tract infection)       Dispostion: I considered admission for this patient, and disposition pending completion of imaging studies.  Please see provider signout for continuation of workup.     Final Clinical Impression(s) / ED Diagnoses Final diagnoses:  Pharyngitis, unspecified etiology     @PCDICTATION @    Albertina Dixon, MD 06/11/23 1201

## 2023-06-11 NOTE — Discharge Instructions (Signed)
Take tylenol 2 pills 4 times a day and motrin 4 pills 3 times a day.  Drink plenty of fluids.  Return for worsening shortness of breath, headache, confusion. Follow up with your family doctor.   Try pepcid or tagamet up to twice a day.  Try to avoid things that may make this worse, most commonly these are spicy foods tomato based products fatty foods chocolate and peppermint.  Alcohol and tobacco can also make this worse.  Return to the emergency department for sudden worsening pain fever or inability to eat or drink.

## 2023-06-14 ENCOUNTER — Telehealth: Payer: Self-pay

## 2023-06-14 NOTE — Transitions of Care (Post Inpatient/ED Visit) (Signed)
   06/14/2023  Name: Tara Joseph MRN: [id number] DOB: Jan 02, 1991  Today's TOC FU Call Status: Today's TOC FU Call Status:: Unsuccessful Call (1st Attempt) Unsuccessful Call (1st Attempt) Date: 06/14/23  Attempted to reach the patient regarding the most recent Inpatient/ED visit.  Follow Up Plan: Additional outreach attempts will be made to reach the patient to complete the Transitions of Care (Post Inpatient/ED visit) call.   Signature Arvil Persons, BSN, Charity fundraiser

## 2023-06-18 ENCOUNTER — Other Ambulatory Visit: Payer: Self-pay

## 2023-06-18 DIAGNOSIS — J4521 Mild intermittent asthma with (acute) exacerbation: Secondary | ICD-10-CM

## 2023-06-18 MED ORDER — ALBUTEROL SULFATE HFA 108 (90 BASE) MCG/ACT IN AERS: 1.0000 | INHALATION_SPRAY | Freq: Four times a day (QID) | RESPIRATORY_TRACT | 0 refills | Status: AC | PRN

## 2023-06-18 NOTE — Telephone Encounter (Signed)
Requesting: Albuterol HFA( PROAIR) Last Visit: 08/28/2021 Next Visit: Visit date not found Last Refill: 07/25/2021 was last prescribed Albuterol Ventolin  Please Advise

## 2023-06-23 NOTE — Transitions of Care (Post Inpatient/ED Visit) (Signed)
   06/23/2023  Name: Tara Joseph MRN: [id number] DOB: [birth year]  Today's TOC FU Call Status: Today's TOC FU Call Status:: Unsuccessful Call (3rd Attempt) Unsuccessful Call (1st Attempt) Date: 06/14/23 Unsuccessful Call (3rd Attempt) Date: 06/23/23  Attempted to reach the patient regarding the most recent Inpatient/ED visit.  Follow Up Plan: No further outreach attempts will be made at this time. We have been unable to contact the patient.  Signature Arvil Persons, BSN, Charity fundraiser

## 2023-07-09 DIAGNOSIS — H5213 Myopia, bilateral: Secondary | ICD-10-CM | POA: Diagnosis not present

## 2023-07-11 DIAGNOSIS — H5213 Myopia, bilateral: Secondary | ICD-10-CM | POA: Diagnosis not present

## 2023-07-17 ENCOUNTER — Other Ambulatory Visit: Payer: Self-pay | Admitting: Nurse Practitioner

## 2023-07-17 DIAGNOSIS — J4521 Mild intermittent asthma with (acute) exacerbation: Secondary | ICD-10-CM

## 2023-07-19 NOTE — Telephone Encounter (Signed)
 Left a voice message for the patient to give me a call back at [phone]

## 2023-07-21 DIAGNOSIS — E669 Obesity, unspecified: Secondary | ICD-10-CM | POA: Diagnosis not present

## 2023-07-21 DIAGNOSIS — R7309 Other abnormal glucose: Secondary | ICD-10-CM | POA: Diagnosis not present

## 2023-07-21 DIAGNOSIS — R635 Abnormal weight gain: Secondary | ICD-10-CM | POA: Diagnosis not present

## 2023-07-21 DIAGNOSIS — E785 Hyperlipidemia, unspecified: Secondary | ICD-10-CM | POA: Diagnosis not present

## 2023-07-21 DIAGNOSIS — D509 Iron deficiency anemia, unspecified: Secondary | ICD-10-CM | POA: Diagnosis not present

## 2023-07-21 DIAGNOSIS — E039 Hypothyroidism, unspecified: Secondary | ICD-10-CM | POA: Diagnosis not present

## 2023-07-21 DIAGNOSIS — Z6839 Body mass index (BMI) 39.0-39.9, adult: Secondary | ICD-10-CM | POA: Diagnosis not present

## 2023-07-21 DIAGNOSIS — N951 Menopausal and female climacteric states: Secondary | ICD-10-CM | POA: Diagnosis not present

## 2023-07-21 DIAGNOSIS — E559 Vitamin D deficiency, unspecified: Secondary | ICD-10-CM | POA: Diagnosis not present

## 2023-07-26 DIAGNOSIS — E785 Hyperlipidemia, unspecified: Secondary | ICD-10-CM | POA: Diagnosis not present

## 2023-07-26 DIAGNOSIS — Z1339 Encounter for screening examination for other mental health and behavioral disorders: Secondary | ICD-10-CM | POA: Diagnosis not present

## 2023-07-26 DIAGNOSIS — E669 Obesity, unspecified: Secondary | ICD-10-CM | POA: Diagnosis not present

## 2023-07-26 DIAGNOSIS — K219 Gastro-esophageal reflux disease without esophagitis: Secondary | ICD-10-CM | POA: Diagnosis not present

## 2023-07-26 DIAGNOSIS — R7309 Other abnormal glucose: Secondary | ICD-10-CM | POA: Diagnosis not present

## 2023-07-26 DIAGNOSIS — Z6839 Body mass index (BMI) 39.0-39.9, adult: Secondary | ICD-10-CM | POA: Diagnosis not present

## 2023-07-26 DIAGNOSIS — Z1331 Encounter for screening for depression: Secondary | ICD-10-CM | POA: Diagnosis not present

## 2023-07-26 DIAGNOSIS — R635 Abnormal weight gain: Secondary | ICD-10-CM | POA: Diagnosis not present

## 2023-07-26 DIAGNOSIS — E039 Hypothyroidism, unspecified: Secondary | ICD-10-CM | POA: Diagnosis not present

## 2023-07-26 DIAGNOSIS — D509 Iron deficiency anemia, unspecified: Secondary | ICD-10-CM | POA: Diagnosis not present

## 2023-07-26 DIAGNOSIS — N951 Menopausal and female climacteric states: Secondary | ICD-10-CM | POA: Diagnosis not present

## 2023-07-26 NOTE — Telephone Encounter (Signed)
 Called and left a voice message asking to give me a call back at [phone]

## 2023-07-28 DIAGNOSIS — Z6839 Body mass index (BMI) 39.0-39.9, adult: Secondary | ICD-10-CM | POA: Diagnosis not present

## 2023-07-28 DIAGNOSIS — R7309 Other abnormal glucose: Secondary | ICD-10-CM | POA: Diagnosis not present

## 2023-07-28 DIAGNOSIS — E669 Obesity, unspecified: Secondary | ICD-10-CM | POA: Diagnosis not present

## 2023-08-04 DIAGNOSIS — Z6838 Body mass index (BMI) 38.0-38.9, adult: Secondary | ICD-10-CM | POA: Diagnosis not present

## 2023-08-04 DIAGNOSIS — F419 Anxiety disorder, unspecified: Secondary | ICD-10-CM | POA: Diagnosis not present

## 2023-08-11 DIAGNOSIS — R7309 Other abnormal glucose: Secondary | ICD-10-CM | POA: Diagnosis not present

## 2023-08-11 DIAGNOSIS — J45909 Unspecified asthma, uncomplicated: Secondary | ICD-10-CM | POA: Diagnosis not present

## 2023-08-11 DIAGNOSIS — Z6838 Body mass index (BMI) 38.0-38.9, adult: Secondary | ICD-10-CM | POA: Diagnosis not present

## 2023-08-15 IMAGING — CT CT ABD-PELV W/O CM
2 of 4 series · 16 of 46 positions shown, 18 images · non-contrast
Comparison: None.

CLINICAL DATA: Abdomen pain



[Series 2: axial st · axial · 0.98mm/px · z∈[+276,+696]mm · 13 of 92 slices shown, 15 images]
[im 4/92  soft-tissue]
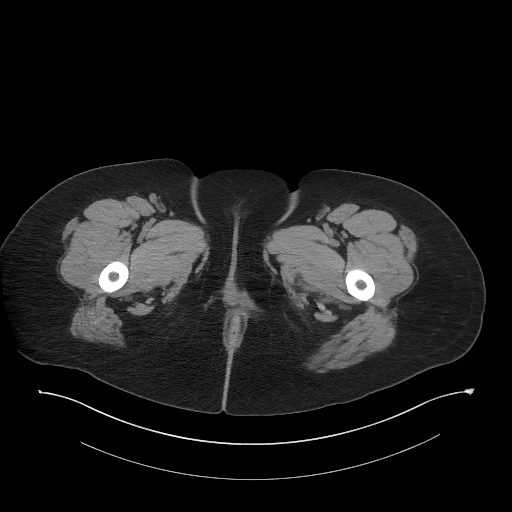
[im 4/92  bone]
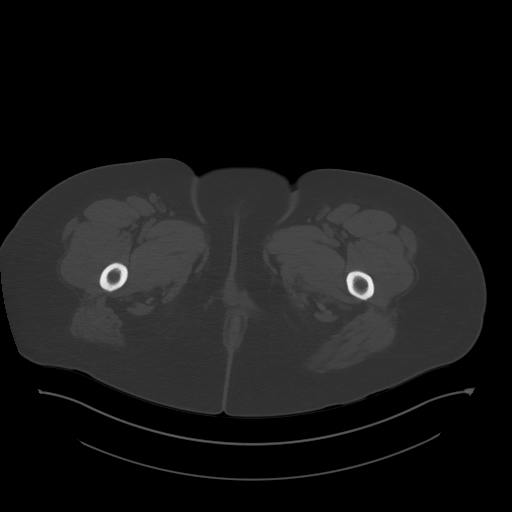
[im 12/92  soft-tissue]
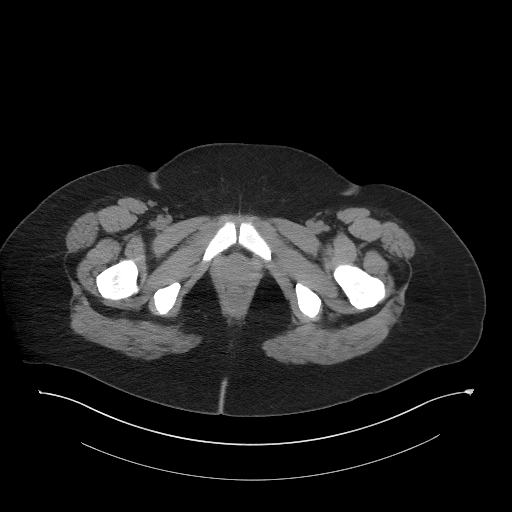
[im 19/92  soft-tissue]
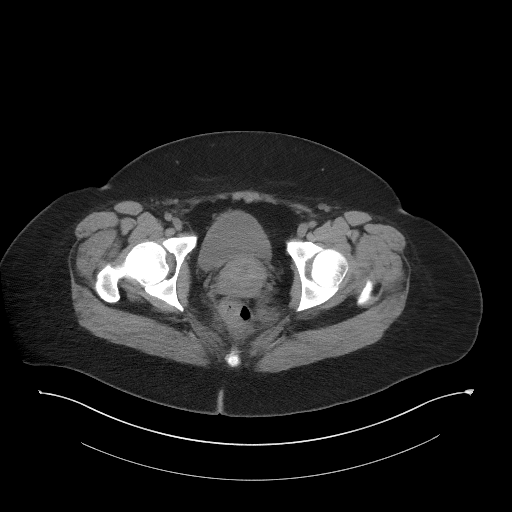
[im 27/92  soft-tissue]
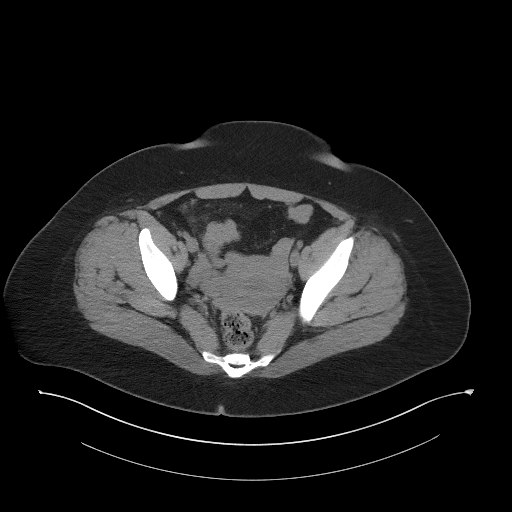
[im 31/92  soft-tissue]
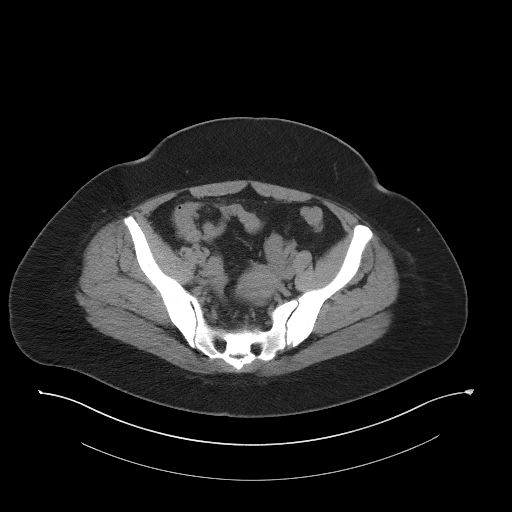
[im 38/92  soft-tissue]
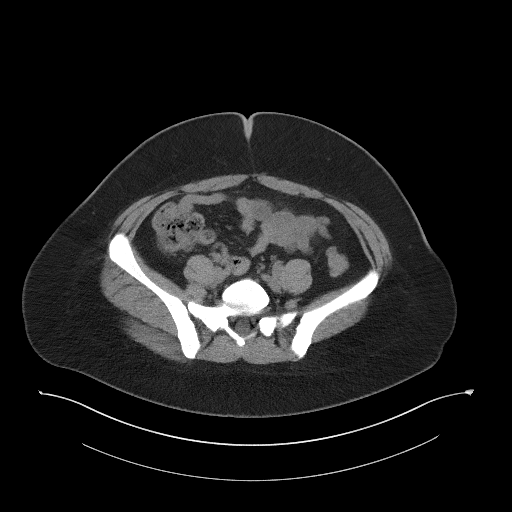
[im 46/92  soft-tissue]
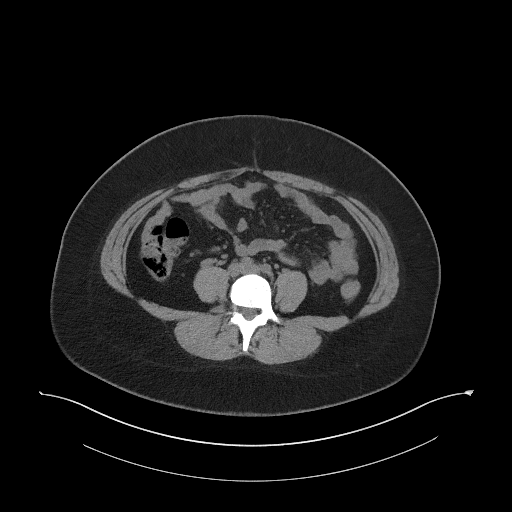
[im 54/92  soft-tissue]
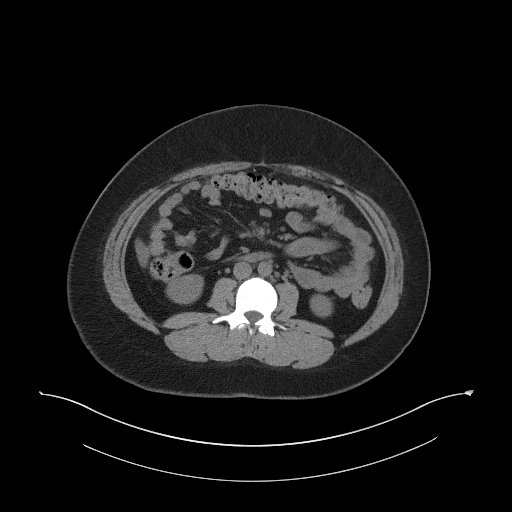
[im 61/92  soft-tissue]
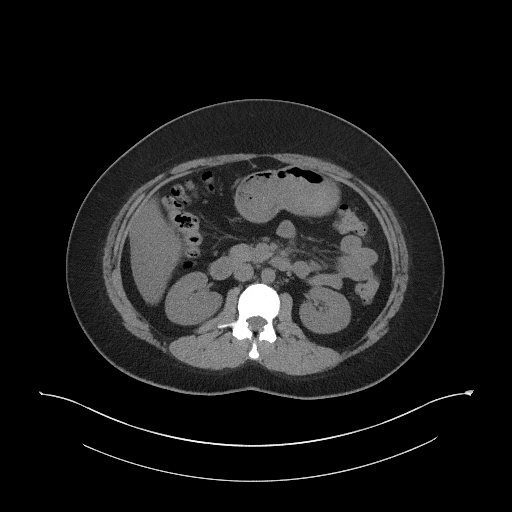
[im 61/92  bone]
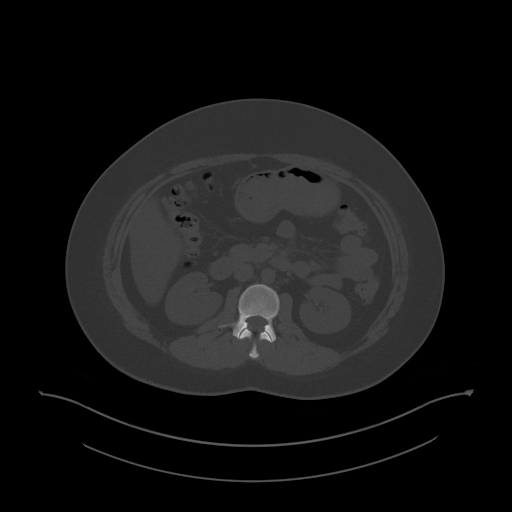
[im 65/92  soft-tissue]
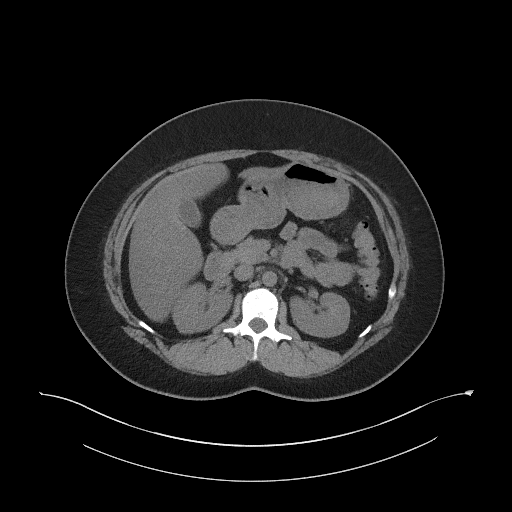
[im 73/92  soft-tissue]
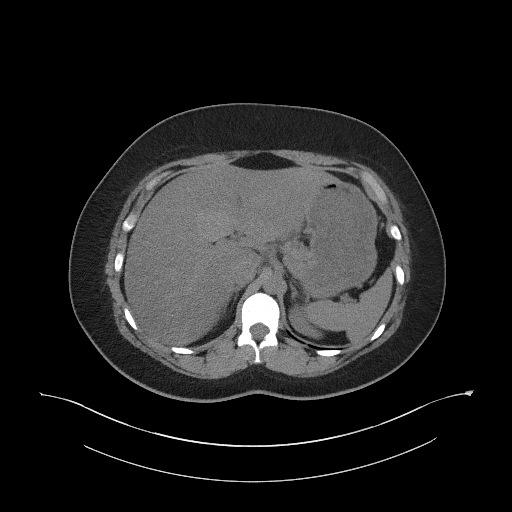
[im 80/92  soft-tissue]
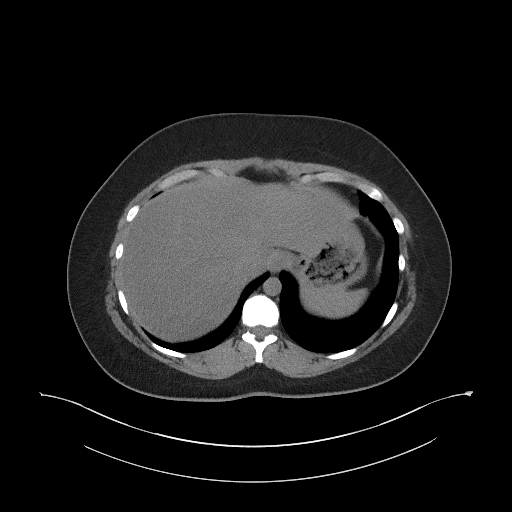
[im 88/92  soft-tissue]
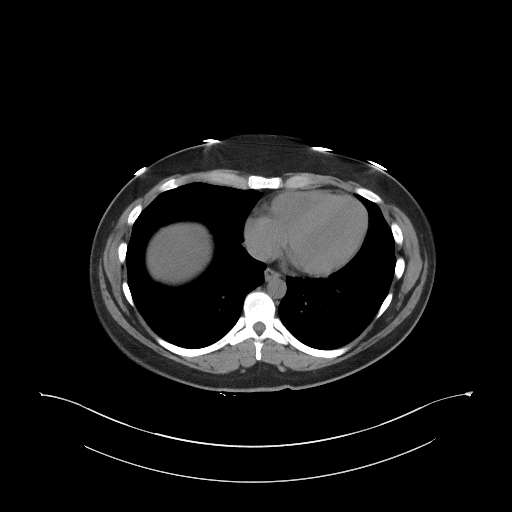

[Series 5: coronal st · coronal · 0.90mm/px · 3 of 117 slices shown]
[im 39/117  soft-tissue]
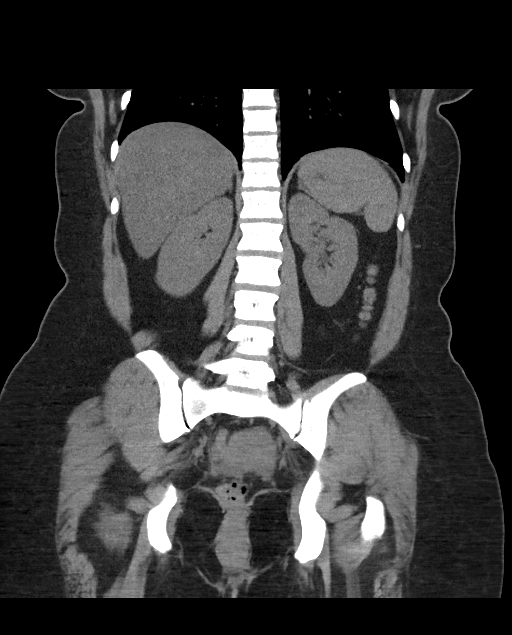
[im 52/117  soft-tissue]
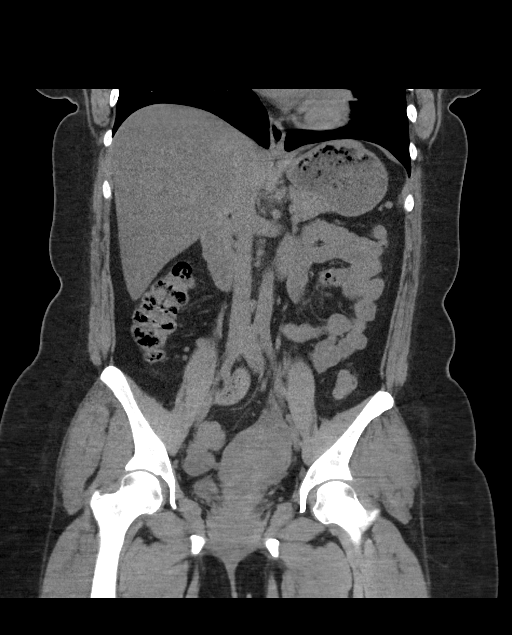
[im 65/117  soft-tissue]
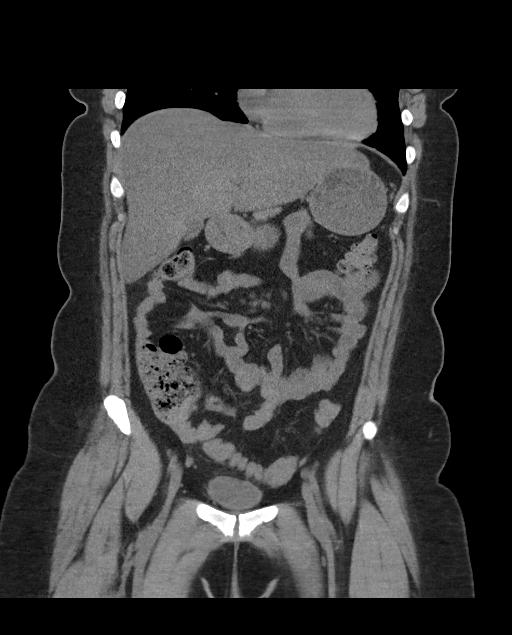

[16 of 46 positions shown; findings below may reference images not displayed]

FINDINGS: Lower chest: No acute abnormality.

Hepatobiliary: Hepatic steatosis. No calcified gallstone or biliary
dilatation

Pancreas: Unremarkable. No pancreatic ductal dilatation or
surrounding inflammatory changes.

Spleen: Normal in size without focal abnormality.

Adrenals/Urinary Tract: Adrenal glands are unremarkable. Kidneys are
normal, without renal calculi, focal lesion, or hydronephrosis.
Bladder is unremarkable.

Stomach/Bowel: Stomach is within normal limits. Appendix appears
normal. No evidence of bowel wall thickening, distention, or
inflammatory changes.

Vascular/Lymphatic: No significant vascular findings are present. No
enlarged abdominal or pelvic lymph nodes.

Reproductive: Probable small uterine fibroids.  No adnexal mass

Other: No abdominal wall hernia or abnormality. No abdominopelvic
ascites.

Musculoskeletal: No acute or significant osseous findings.
IMPRESSION: 1. No CT evidence for acute intra-abdominal or pelvic abnormality.
2. Hepatic steatosis
3. Probable small uterine fibroids

## 2023-08-23 DIAGNOSIS — E039 Hypothyroidism, unspecified: Secondary | ICD-10-CM | POA: Diagnosis not present

## 2023-08-23 DIAGNOSIS — D509 Iron deficiency anemia, unspecified: Secondary | ICD-10-CM | POA: Diagnosis not present

## 2023-08-25 DIAGNOSIS — Z6838 Body mass index (BMI) 38.0-38.9, adult: Secondary | ICD-10-CM | POA: Diagnosis not present

## 2023-08-25 DIAGNOSIS — E039 Hypothyroidism, unspecified: Secondary | ICD-10-CM | POA: Diagnosis not present

## 2023-08-25 DIAGNOSIS — E559 Vitamin D deficiency, unspecified: Secondary | ICD-10-CM | POA: Diagnosis not present

## 2023-08-25 DIAGNOSIS — N951 Menopausal and female climacteric states: Secondary | ICD-10-CM | POA: Diagnosis not present

## 2023-08-25 DIAGNOSIS — D509 Iron deficiency anemia, unspecified: Secondary | ICD-10-CM | POA: Diagnosis not present

## 2023-09-02 DIAGNOSIS — D509 Iron deficiency anemia, unspecified: Secondary | ICD-10-CM | POA: Diagnosis not present

## 2023-09-02 DIAGNOSIS — Z6838 Body mass index (BMI) 38.0-38.9, adult: Secondary | ICD-10-CM | POA: Diagnosis not present

## 2023-09-02 DIAGNOSIS — E039 Hypothyroidism, unspecified: Secondary | ICD-10-CM | POA: Diagnosis not present

## 2023-09-16 DIAGNOSIS — K219 Gastro-esophageal reflux disease without esophagitis: Secondary | ICD-10-CM | POA: Diagnosis not present

## 2023-09-16 DIAGNOSIS — Z6838 Body mass index (BMI) 38.0-38.9, adult: Secondary | ICD-10-CM | POA: Diagnosis not present

## 2023-09-23 DIAGNOSIS — Z6838 Body mass index (BMI) 38.0-38.9, adult: Secondary | ICD-10-CM | POA: Diagnosis not present

## 2023-09-23 DIAGNOSIS — E559 Vitamin D deficiency, unspecified: Secondary | ICD-10-CM | POA: Diagnosis not present

## 2023-09-23 DIAGNOSIS — K219 Gastro-esophageal reflux disease without esophagitis: Secondary | ICD-10-CM | POA: Diagnosis not present

## 2023-10-13 DIAGNOSIS — K219 Gastro-esophageal reflux disease without esophagitis: Secondary | ICD-10-CM | POA: Diagnosis not present

## 2023-10-13 DIAGNOSIS — Z6839 Body mass index (BMI) 39.0-39.9, adult: Secondary | ICD-10-CM | POA: Diagnosis not present

## 2023-10-13 DIAGNOSIS — R7309 Other abnormal glucose: Secondary | ICD-10-CM | POA: Diagnosis not present

## 2023-10-28 DIAGNOSIS — Z6838 Body mass index (BMI) 38.0-38.9, adult: Secondary | ICD-10-CM | POA: Diagnosis not present

## 2023-10-28 DIAGNOSIS — K219 Gastro-esophageal reflux disease without esophagitis: Secondary | ICD-10-CM | POA: Diagnosis not present

## 2023-10-28 DIAGNOSIS — R7309 Other abnormal glucose: Secondary | ICD-10-CM | POA: Diagnosis not present

## 2023-11-09 DIAGNOSIS — Z6838 Body mass index (BMI) 38.0-38.9, adult: Secondary | ICD-10-CM | POA: Diagnosis not present

## 2023-11-09 DIAGNOSIS — E559 Vitamin D deficiency, unspecified: Secondary | ICD-10-CM | POA: Diagnosis not present

## 2024-01-17 ENCOUNTER — Encounter: Payer: Self-pay | Admitting: Nurse Practitioner

## 2024-01-17 ENCOUNTER — Ambulatory Visit: Admitting: Nurse Practitioner

## 2024-01-17 VITALS — BP 128/88 | HR 76 | Temp 97.1°F | Ht 60.0 in | Wt 200.2 lb

## 2024-01-17 DIAGNOSIS — K219 Gastro-esophageal reflux disease without esophagitis: Secondary | ICD-10-CM | POA: Diagnosis not present

## 2024-01-17 DIAGNOSIS — E669 Obesity, unspecified: Secondary | ICD-10-CM | POA: Diagnosis not present

## 2024-01-17 DIAGNOSIS — Z6839 Body mass index (BMI) 39.0-39.9, adult: Secondary | ICD-10-CM | POA: Diagnosis not present

## 2024-01-17 DIAGNOSIS — E039 Hypothyroidism, unspecified: Secondary | ICD-10-CM | POA: Diagnosis not present

## 2024-01-17 DIAGNOSIS — Z309 Encounter for contraceptive management, unspecified: Secondary | ICD-10-CM | POA: Insufficient documentation

## 2024-01-17 DIAGNOSIS — Z3009 Encounter for other general counseling and advice on contraception: Secondary | ICD-10-CM | POA: Insufficient documentation

## 2024-01-17 DIAGNOSIS — R5383 Other fatigue: Secondary | ICD-10-CM | POA: Diagnosis not present

## 2024-01-17 MED ORDER — OMEPRAZOLE 40 MG PO CPDR: 40.0000 mg | DELAYED_RELEASE_CAPSULE | Freq: Every day | ORAL | 1 refills | Status: DC

## 2024-01-17 MED ORDER — NORELGESTROMIN-ETH ESTRADIOL 150-35 MCG/24HR TD PTWK: 1.0000 | MEDICATED_PATCH | TRANSDERMAL | 3 refills | Status: DC

## 2024-01-17 MED ORDER — LEVOTHYROXINE SODIUM 25 MCG PO TABS: 25.0000 ug | ORAL_TABLET | Freq: Every morning | ORAL | 0 refills | Status: DC

## 2024-01-17 NOTE — Patient Instructions (Signed)
 It was great to see you!  We are checking your labs today and will let you know the results via mychart/phone.   I have placed a referral to sleep medicine   I have refilled your thyroid  medication  Start xulane patch one a week for 3 weeks, then leave it off for 1 week  Start around the first day of your next period  Let's follow-up in 2 months, sooner if you have concerns.  If a referral was placed today, you will be contacted for an appointment. Please note that routine referrals can sometimes take up to 3-4 weeks to process. Please call our office if you haven't heard anything after this time frame.  Take care,  Tinnie Harada, NP

## 2024-01-17 NOTE — Assessment & Plan Note (Signed)
 She started levothyroxine  25mcg three months ago and reports occasional forgetfulness in taking medication, contributing to fatigue. Refill thyroid  medication. Check thyroid  levels today to assess if dosage adjustment is needed.

## 2024-01-17 NOTE — Assessment & Plan Note (Signed)
 She prefers the transdermal patch due to forgetfulness with daily pills. Concerns about patch adherence with exercise were addressed. Start Xulane patch once a week for three weeks, then leave it off for one week. Advise starting the patch on the first day of the next menstrual period for immediate effectiveness. Discuss the use of patch covers for reinforcement during exercise.

## 2024-01-17 NOTE — Assessment & Plan Note (Signed)
 She reports significant fatigue, lack of energy, and not feeling refreshed after sleep. Check iron, ferritin, and thyroid  levels to evaluate for deficiencies contributing to fatigue. Will also place referral to sleep medicine for concern with possible sleep apnea.

## 2024-01-17 NOTE — Assessment & Plan Note (Signed)
 She experiences nocturnal symptoms of GERD, including a burning sensation in the throat and choking. Symptoms previously improved with omeprazole . Prescribe omeprazole  40mg  once daily, to be taken before eating, preferably with thyroid  medication.

## 2024-01-17 NOTE — Progress Notes (Signed)
 Established Patient Office Visit  Subjective   Patient ID: Tara Joseph, female    DOB: 08-Jun-1990  Age: 33 y.o. MRN: [id number]  Chief Complaint  Patient presents with   Medication Management    Wants to discuss birth control options and concerns with low iron, fatigue and weight    HPI Discussed the use of AI scribe software for clinical note transcription with the patient, who gave verbal consent to proceed.  History of Present Illness   Tara Joseph is a 33 year old female who presents for a consultation regarding birth control options.  She is getting married in October and is exploring birth control options. She previously used birth control pills but had difficulty with adherence, resulting in missed doses and breakthrough bleeding. She would like to discuss options.  She has been on levothyroxine  25mcg daily for three months but sometimes forgets to take it due to her work schedule. She experiences fatigue and lacks energy despite consistent use of iron supplements.  She has a history of severe nighttime reflux, previously requiring ER visits due to choking sensations and burning in her throat. Omeprazole  alleviated these symptoms. She struggles with weight loss despite trying various methods, including a GLP-1 medication and following with [facility] MD.       ROS See pertinent positives and negatives per HPI.    Objective:     BP 128/88 (BP Location: Left Arm, Patient Position: Sitting, Cuff Size: Normal)   Pulse 76   Temp (!) 97.1 F (36.2 C)   Ht 5' (1.524 m)   Wt 200 lb 3.2 oz (90.8 kg)   LMP 01/01/2024 (Exact Date)   SpO2 99%   BMI 39.10 kg/m    Physical Exam Vitals and nursing note reviewed.  Constitutional:      General: She is not in acute distress.    Appearance: Normal appearance.  HENT:     Head: Normocephalic.  Eyes:     Conjunctiva/sclera: Conjunctivae normal.  Cardiovascular:     Rate and Rhythm: Normal rate and regular rhythm.      Pulses: Normal pulses.     Heart sounds: Normal heart sounds.  Pulmonary:     Effort: Pulmonary effort is normal.     Breath sounds: Normal breath sounds.  Musculoskeletal:     Cervical back: Normal range of motion.  Skin:    General: Skin is warm.  Neurological:     General: No focal deficit present.     Mental Status: She is alert and oriented to person, place, and time.  Psychiatric:        Mood and Affect: Mood normal.        Behavior: Behavior normal.        Thought Content: Thought content normal.        Judgment: Judgment normal.      Assessment & Plan:   Problem List Items Addressed This Visit       Digestive   Gastroesophageal reflux disease   She experiences nocturnal symptoms of GERD, including a burning sensation in the throat and choking. Symptoms previously improved with omeprazole . Prescribe omeprazole  40mg  once daily, to be taken before eating, preferably with thyroid  medication.       Relevant Medications   omeprazole  (PRILOSEC) 40 MG capsule     Endocrine   Hypothyroidism   She started levothyroxine  25mcg three months ago and reports occasional forgetfulness in taking medication, contributing to fatigue. Refill thyroid  medication. Check thyroid  levels today to assess  if dosage adjustment is needed.      Relevant Medications   levothyroxine  (SYNTHROID ) 25 MCG tablet   Other Relevant Orders   TSH   T4, free     Other   Birth control counseling   She prefers the transdermal patch due to forgetfulness with daily pills. Concerns about patch adherence with exercise were addressed. Start Xulane patch once a week for three weeks, then leave it off for one week. Advise starting the patch on the first day of the next menstrual period for immediate effectiveness. Discuss the use of patch covers for reinforcement during exercise.      Obesity (BMI 30-39.9)   BMI 39.1. She reports difficulty with weight loss despite attempts with GLP-1 medication with [facility]  MD. No signs of PCOS such as hirsutism. Check A1c, FSH, LH, and testosterone  today.       Relevant Orders   FSH/LH   Testosterone    Hemoglobin A1c   Fatigue - Primary   She reports significant fatigue, lack of energy, and not feeling refreshed after sleep. Check iron, ferritin, and thyroid  levels to evaluate for deficiencies contributing to fatigue. Will also place referral to sleep medicine for concern with possible sleep apnea.       Relevant Orders   Ambulatory referral to Sleep Studies   CBC with Differential/Platelet   Comprehensive metabolic panel with GFR   Vitamin B12   VITAMIN D  25 Hydroxy (Vit-D Deficiency, Fractures)   Ferritin   Iron   Return in about 2 months (around 03/18/2024) for CPE with PCP.    Tinnie DELENA Harada, NP

## 2024-01-17 NOTE — Assessment & Plan Note (Signed)
 BMI 39.1. She reports difficulty with weight loss despite attempts with GLP-1 medication with [facility] MD. No signs of PCOS such as hirsutism. Check A1c, FSH, LH, and testosterone  today.

## 2024-01-18 ENCOUNTER — Telehealth: Payer: Self-pay

## 2024-01-18 LAB — CBC WITH DIFFERENTIAL/PLATELET
Basophils Absolute: 0.1 K/uL (ref 0.0–0.1)
Basophils Relative: 1.3 % (ref 0.0–3.0)
Eosinophils Absolute: 0.1 K/uL (ref 0.0–0.7)
Eosinophils Relative: 2 % (ref 0.0–5.0)
HCT: 41.4 % (ref 36.0–46.0)
Hemoglobin: 14 g/dL (ref 12.0–15.0)
Lymphocytes Relative: 32.9 % (ref 12.0–46.0)
Lymphs Abs: 2.2 K/uL (ref 0.7–4.0)
MCHC: 33.7 g/dL (ref 30.0–36.0)
MCV: 87.5 fl (ref 78.0–100.0)
Monocytes Absolute: 0.7 K/uL (ref 0.1–1.0)
Monocytes Relative: 10.3 % (ref 3.0–12.0)
Neutro Abs: 3.6 K/uL (ref 1.4–7.7)
Neutrophils Relative %: 53.5 % (ref 43.0–77.0)
Platelets: 324 K/uL (ref 150.0–400.0)
RBC: 4.73 Mil/uL (ref 3.87–5.11)
RDW: 14 % (ref 11.5–15.5)
WBC: 6.8 K/uL (ref 4.0–10.5)

## 2024-01-18 LAB — HEMOGLOBIN A1C: Hgb A1c MFr Bld: 5.4 % (ref 4.6–6.5)

## 2024-01-18 LAB — VITAMIN D 25 HYDROXY (VIT D DEFICIENCY, FRACTURES): VITD: 25.99 ng/mL — ABNORMAL LOW (ref 30.00–100.00)

## 2024-01-18 LAB — TESTOSTERONE: Testosterone: 7.47 ng/dL — ABNORMAL LOW (ref 15.00–40.00)

## 2024-01-18 LAB — COMPREHENSIVE METABOLIC PANEL WITH GFR
ALT: 19 U/L (ref 0–35)
AST: 16 U/L (ref 0–37)
Albumin: 4.4 g/dL (ref 3.5–5.2)
Alkaline Phosphatase: 60 U/L (ref 39–117)
BUN: 14 mg/dL (ref 6–23)
CO2: 23 meq/L (ref 19–32)
Calcium: 9.7 mg/dL (ref 8.4–10.5)
Chloride: 104 meq/L (ref 96–112)
Creatinine, Ser: 0.61 mg/dL (ref 0.40–1.20)
GFR: 119.27 mL/min (ref >60.00)
Glucose, Bld: 76 mg/dL (ref 70–99)
Potassium: 3.9 meq/L (ref 3.5–5.1)
Sodium: 139 meq/L (ref 135–145)
Total Bilirubin: 0.3 mg/dL (ref 0.2–1.2)
Total Protein: 6.7 g/dL (ref 6.0–8.3)

## 2024-01-18 LAB — FERRITIN: Ferritin: 10.6 ng/mL (ref 10.0–291.0)

## 2024-01-18 LAB — T4, FREE: Free T4: 0.91 ng/dL (ref 0.60–1.60)

## 2024-01-18 LAB — FSH/LH
FSH: 2.9 m[IU]/mL
LH: 2.7 m[IU]/mL

## 2024-01-18 LAB — TSH: TSH: 2.31 u[IU]/mL (ref 0.35–5.50)

## 2024-01-18 LAB — IRON: Iron: 114 ug/dL (ref 42–145)

## 2024-01-18 LAB — VITAMIN B12: Vitamin B-12: 347 pg/mL (ref 211–911)

## 2024-01-24 ENCOUNTER — Ambulatory Visit: Payer: Self-pay | Admitting: Nurse Practitioner

## 2024-02-09 NOTE — Telephone Encounter (Signed)
Work note sent via [employer].

## 2024-02-15 ENCOUNTER — Other Ambulatory Visit: Payer: Self-pay | Admitting: Nurse Practitioner

## 2024-02-15 NOTE — Telephone Encounter (Signed)
 Requesting: OMEPRAZOLE  DR 40 MG CAPSULE  Last Visit: 01/17/2024 Next Visit: Visit date not found Last Refill: 03/22/2024  Please Advise

## 2024-03-22 ENCOUNTER — Encounter: Payer: Self-pay | Admitting: Nurse Practitioner

## 2024-03-22 ENCOUNTER — Ambulatory Visit (INDEPENDENT_AMBULATORY_CARE_PROVIDER_SITE_OTHER): Admitting: Nurse Practitioner

## 2024-03-22 ENCOUNTER — Other Ambulatory Visit: Payer: Self-pay | Admitting: Nurse Practitioner

## 2024-03-22 VITALS — BP 126/74 | HR 81 | Temp 97.9°F | Ht 60.0 in | Wt 204.6 lb

## 2024-03-22 DIAGNOSIS — Z3045 Encounter for surveillance of transdermal patch hormonal contraceptive device: Secondary | ICD-10-CM | POA: Diagnosis not present

## 2024-03-22 DIAGNOSIS — Z23 Encounter for immunization: Secondary | ICD-10-CM | POA: Diagnosis not present

## 2024-03-22 DIAGNOSIS — N6322 Unspecified lump in the left breast, upper inner quadrant: Secondary | ICD-10-CM | POA: Diagnosis not present

## 2024-03-22 DIAGNOSIS — E039 Hypothyroidism, unspecified: Secondary | ICD-10-CM | POA: Diagnosis not present

## 2024-03-22 DIAGNOSIS — Z0001 Encounter for general adult medical examination with abnormal findings: Secondary | ICD-10-CM

## 2024-03-22 MED ORDER — NORELGESTROMIN-ETH ESTRADIOL 150-35 MCG/24HR TD PTWK: 1.0000 | MEDICATED_PATCH | TRANSDERMAL | 11 refills | Status: DC

## 2024-03-22 MED ORDER — LEVOTHYROXINE SODIUM 25 MCG PO TABS: 25.0000 ug | ORAL_TABLET | Freq: Every day | ORAL | 1 refills | Status: AC

## 2024-03-22 NOTE — Patient Instructions (Addendum)
 Maintain Heart healthy diet and daily exercise. Maintain current medications.  Preventive Care 31-31 Years Old, Female Preventive care refers to lifestyle choices and visits with your health care provider that can promote health and wellness. Preventive care visits are also called wellness exams. What can I expect for my preventive care visit? Counseling During your preventive care visit, your health care provider may ask about your: Medical history, including: Past medical problems. Family medical history. Pregnancy history. Current health, including: Menstrual cycle. Method of birth control. Emotional well-being. Home life and relationship well-being. Sexual activity and sexual health. Lifestyle, including: Alcohol, nicotine or tobacco, and drug use. Access to firearms. Diet, exercise, and sleep habits. Work and work Astronomer. Sunscreen use. Safety issues such as seatbelt and bike helmet use. Physical exam Your health care provider may check your: Height and weight. These may be used to calculate your BMI (body mass index). BMI is a measurement that tells if you are at a healthy weight. Waist circumference. This measures the distance around your waistline. This measurement also tells if you are at a healthy weight and may help predict your risk of certain diseases, such as type 2 diabetes and high blood pressure. Heart rate and blood pressure. Body temperature. Skin for abnormal spots. What immunizations do I need?  Vaccines are usually given at various ages, according to a schedule. Your health care provider will recommend vaccines for you based on your age, medical history, and lifestyle or other factors, such as travel or where you work. What tests do I need? Screening Your health care provider may recommend screening tests for certain conditions. This may include: Pelvic exam and Pap test. Lipid and cholesterol levels. Diabetes screening. This is done by checking your  blood sugar (glucose) after you have not eaten for a while (fasting). Hepatitis B test. Hepatitis C test. HIV (human immunodeficiency virus) test. STI (sexually transmitted infection) testing, if you are at risk. BRCA-related cancer screening. This may be done if you have a family history of breast, ovarian, tubal, or peritoneal cancers. Talk with your health care provider about your test results, treatment options, and if necessary, the need for more tests. Follow these instructions at home: Eating and drinking  Eat a healthy diet that includes fresh fruits and vegetables, whole grains, lean protein, and low-fat dairy products. Take vitamin and mineral supplements as recommended by your health care provider. Do not drink alcohol if: Your health care provider tells you not to drink. You are pregnant, may be pregnant, or are planning to become pregnant. If you drink alcohol: Limit how much you have to 0-1 drink a day. Know how much alcohol is in your drink. In the [place] one drink equals one 12 oz bottle of beer (355 mL), one 5 oz glass of wine (148 mL), or one 1 oz glass of hard liquor (44 mL). Lifestyle Brush your teeth every morning and night with fluoride toothpaste. Floss one time each day. Exercise for at least 30 minutes 5 or more days each week. Do not use any products that contain nicotine or tobacco. These products include cigarettes, chewing tobacco, and vaping devices, such as e-cigarettes. If you need help quitting, ask your health care provider. Do not use drugs. If you are sexually active, practice safe sex. Use a condom or other form of protection to prevent STIs. If you do not wish to become pregnant, use a form of birth control. If you plan to become pregnant, see your health care provider for a  prepregnancy visit. Find healthy ways to manage stress, such as: Meditation, yoga, or listening to music. Journaling. Talking to a trusted person. Spending time with friends and  family. Minimize exposure to UV radiation to reduce your risk of skin cancer. Safety Always wear your seat belt while driving or riding in a vehicle. Do not drive: If you have been drinking alcohol. Do not ride with someone who has been drinking. If you have been using any mind-altering substances or drugs. While texting. When you are tired or distracted. Wear a helmet and other protective equipment during sports activities. If you have firearms in your house, make sure you follow all gun safety procedures. Seek help if you have been physically or sexually abused. What's next? Go to your health care provider once a year for an annual wellness visit. Ask your health care provider how often you should have your eyes and teeth checked. Stay up to date on all vaccines. This information is not intended to replace advice given to you by your health care provider. Make sure you discuss any questions you have with your health care provider. Document Revised: 10/16/2020 Document Reviewed: 10/16/2020 Elsevier Patient Education  2024 [employer].

## 2024-03-22 NOTE — Progress Notes (Signed)
 Complete physical exam  Patient: Tara Joseph   DOB: Oct 03, 1990   33 y.o. Female  MRN: [id number] Visit Date: 03/22/2024  Subjective:    Chief Complaint  Patient presents with   Annual Exam    FASTING  Requesting refills on Xulane  DUE for PAP, HIV & Hep C screenings, HPV and Prevnar vaccines    Tara Joseph is a 33 y.o. female who presents today for a complete physical exam. She reports consuming a general diet. Walking and weight training She generally feels well. She reports sleeping well. She does have additional problems to discuss today.  Vision:No Dental:Yes STD Screen:No  BP Readings from Last 3 Encounters:  03/22/24 126/74  01/17/24 128/88  06/11/23 (!) 154/98   Wt Readings from Last 3 Encounters:  03/22/24 204 lb 9.6 oz (92.8 kg)  01/17/24 200 lb 3.2 oz (90.8 kg)  08/28/21 202 lb 3.2 oz (91.7 kg)   Most recent fall risk assessment:    03/22/2024    9:13 AM  Fall Risk   Falls in the past year? 0  Number falls in past yr: 0  Injury with Fall? 0  Risk for fall due to : No Fall Risks  Follow up Falls evaluation completed   Depression screen:Yes - Depression  Most recent depression screenings:    03/22/2024    9:13 AM 01/17/2024    3:38 PM  PHQ 2/9 Scores  PHQ - 2 Score 1 3  PHQ- 9 Score 6 6      Data saved with a previous flowsheet row definition    HPI  No problem-specific Assessment & Plan notes found for this encounter.   Past Medical History:  Diagnosis Date   Arthritis    Asthma    Migraines    Prediabetes    UTI (urinary tract infection)    History reviewed. No pertinent surgical history. Social History   Socioeconomic History   Marital status: Significant Other    Spouse name: Not on file   Number of children: 0   Years of education: Not on file   Highest education level: Not on file  Occupational History   Not on file  Tobacco Use   Smoking status: Never   Smokeless tobacco: Never  Vaping Use   Vaping status: Never  Used  Substance and Sexual Activity   Alcohol use: Not Currently   Drug use: Not Currently   Sexual activity: Never    Birth control/protection: Abstinence  Other Topics Concern   Not on file  Social History Narrative   ** Merged History Encounter **       Social Drivers of Corporate Investment Banker Strain: Not on file  Food Insecurity: Not on file  Transportation Needs: Not on file  Physical Activity: Not on file  Stress: Not on file  Social Connections: Not on file  Intimate Partner Violence: Not on file   Family Status  Relation Name Status   Mother  Alive   Father  Alive   Brother  (Not Specified)   MGM  (Not Specified)   Brother  (Not Specified)  No partnership data on file   Family History  Problem Relation Age of Onset   Hypertension Mother    Thyroid  disease Mother    Diabetes Father    Hypertension Brother    Arthritis Maternal Grandmother    Hypertension Brother    No Known Allergies  Patient Care Team: Fendi Meinhardt, Roselie Rockford, NP as PCP - General (Internal  Medicine) Dallis Czaja, Roselie Rockford, NP (Internal Medicine)   Medications: Outpatient Medications Prior to Visit  Medication Sig   albuterol  (VENTOLIN  HFA) 108 (90 Base) MCG/ACT inhaler Inhale 1-2 puffs into the lungs every 6 (six) hours as needed for wheezing or shortness of breath. No additional refill without appointment with pcp   Ferrous Sulfate (IRON PO) Take by mouth daily.   omeprazole  (PRILOSEC) 40 MG capsule TAKE 1 CAPSULE (40 MG TOTAL) BY MOUTH DAILY.   Probiotic Product (CULTURELLE PROBIOTICS) CHEW Take one a day for 2 weeks   [DISCONTINUED] levothyroxine  (SYNTHROID ) 25 MCG tablet Take 1 tablet (25 mcg total) by mouth every morning.   [DISCONTINUED] norelgestromin -ethinyl estradiol  (XULANE) 150-35 MCG/24HR transdermal patch Place 1 patch onto the skin once a week.   No facility-administered medications prior to visit.   Review of Systems  Constitutional:  Negative for activity change,  appetite change and unexpected weight change.  Respiratory: Negative.    Cardiovascular: Negative.   Gastrointestinal: Negative.   Endocrine: Negative for cold intolerance and heat intolerance.  Genitourinary: Negative.   Musculoskeletal: Negative.   Skin: Negative.   Neurological: Negative.   Hematological: Negative.   Psychiatric/Behavioral:  Negative for behavioral problems, decreased concentration, dysphoric mood, hallucinations, self-injury, sleep disturbance and suicidal ideas. The patient is not nervous/anxious.    Last CBC Lab Results  Component Value Date   WBC 6.8 01/17/2024   HGB 14.0 01/17/2024   HCT 41.4 01/17/2024   MCV 87.5 01/17/2024   MCH 25.9 (L) 06/10/2023   RDW 14.0 01/17/2024   PLT 324.0 01/17/2024   Last metabolic panel Lab Results  Component Value Date   GLUCOSE 76 01/17/2024   NA 139 01/17/2024   K 3.9 01/17/2024   CL 104 01/17/2024   CO2 23 01/17/2024   BUN 14 01/17/2024   CREATININE 0.61 01/17/2024   GFR 119.27 01/17/2024   CALCIUM 9.7 01/17/2024   PROT 6.7 01/17/2024   ALBUMIN 4.4 01/17/2024   BILITOT 0.3 01/17/2024   ALKPHOS 60 01/17/2024   AST 16 01/17/2024   ALT 19 01/17/2024   ANIONGAP 9 06/10/2023   Last hemoglobin A1c Lab Results  Component Value Date   HGBA1C 5.4 01/17/2024   Last thyroid  functions Lab Results  Component Value Date   TSH 2.31 01/17/2024   FREET4 0.91 01/17/2024   Last vitamin D  Lab Results  Component Value Date   VD25OH 25.99 (L) 01/17/2024        Objective:  BP 126/74 (BP Location: Left Arm, Patient Position: Sitting, Cuff Size: Large)   Pulse 81   Temp 97.9 F (36.6 C) (Oral)   Ht 5' (1.524 m)   Wt 204 lb 9.6 oz (92.8 kg)   LMP 03/20/2024   SpO2 98%   BMI 39.96 kg/m     Physical Exam Vitals and nursing note reviewed.  Constitutional:      General: She is not in acute distress. HENT:     Right Ear: Tympanic membrane, ear canal and external ear normal.     Left Ear: Tympanic membrane,  ear canal and external ear normal.     Nose: Nose normal.  Eyes:     Extraocular Movements: Extraocular movements intact.     Conjunctiva/sclera: Conjunctivae normal.     Pupils: Pupils are equal, round, and reactive to light.  Neck:     Thyroid : No thyroid  mass, thyromegaly or thyroid  tenderness.  Cardiovascular:     Rate and Rhythm: Normal rate and regular rhythm.  Pulses: Normal pulses.     Heart sounds: Normal heart sounds.  Pulmonary:     Effort: Pulmonary effort is normal.     Breath sounds: Normal breath sounds.  Chest:  Breasts:    Breasts are asymmetrical.     Right: Normal.     Left: Mass and tenderness present. No swelling, bleeding, inverted nipple, nipple discharge or skin change.    Abdominal:     General: Bowel sounds are normal.     Palpations: Abdomen is soft.  Musculoskeletal:        General: Normal range of motion.     Cervical back: Normal range of motion and neck supple.     Right lower leg: No edema.     Left lower leg: No edema.  Lymphadenopathy:     Cervical: No cervical adenopathy.     Upper Body:     Right upper body: No supraclavicular, axillary or pectoral adenopathy.     Left upper body: No supraclavicular, axillary or pectoral adenopathy.  Skin:    General: Skin is warm and dry.  Neurological:     Mental Status: She is alert and oriented to person, place, and time.     Cranial Nerves: No cranial nerve deficit.  Psychiatric:        Mood and Affect: Mood normal.        Behavior: Behavior normal.        Thought Content: Thought content normal.     No results found for any visits on 03/22/24.    Assessment & Plan:    Routine Health Maintenance and Physical Exam  Immunization History  Administered Date(s) Administered   DTP 12/11/1992, 06/18/1993, 01/14/1994   DTP / HiB 06/23/1994   DTaP 11/07/1996   HIB, Unspecified 12/11/1992, 06/18/1993, 01/14/1994   HPV 9-valent 02/14/2020   Hep B, Unspecified 12/11/1992, 06/18/1993,  06/23/1994   Hepatitis A, Ped/Adol-2 Dose 01/10/2007, 07/05/2009   Influenza Nasal 02/25/2010   Influenza, Seasonal, Injecte, Preservative Fre 07/05/2009, 03/22/2024   Influenza,inj,Quad PF,6+ Mos 01/24/2019, 01/11/2020   Influenza-Unspecified 01/11/2020   MMR 01/14/1994, 11/07/1996   Meningococcal Conjugate 01/10/2007, 02/25/2010   OPV 12/11/1992, 06/18/1993, 01/14/1994, 11/07/1996   Tdap 01/10/2007, 12/13/2017   Varicella 01/10/2007, 07/05/2009   Health Maintenance  Topic Date Due   HIV Screening  Never done   Hepatitis C Screening  Never done   Pneumococcal Vaccine (1 of 2 - PCV) Never done   HPV VACCINES (2 - 3-dose SCDM series) 03/13/2020   COVID-19 Vaccine (1 - 2025-26 season) 05/03/2024 (Originally 01/03/2024)   Cervical Cancer Screening (HPV/Pap Cotest)  03/22/2025 (Originally 10/17/2022)   DTaP/Tdap/Td (8 - Td or Tdap) 12/14/2027   Influenza Vaccine  Completed   Meningococcal B Vaccine  Aged Out   Discussed health benefits of physical activity, and encouraged her to engage in regular exercise appropriate for her age and condition.  Problem List Items Addressed This Visit     Hypothyroidism   Relevant Medications   levothyroxine  (SYNTHROID ) 25 MCG tablet   Other Visit Diagnoses       Encounter for preventative adult health care exam with abnormal findings    -  Primary     Immunization due       Relevant Orders   HPV 9-valent vaccine,Recombinat   Flu vaccine trivalent PF, 6mos and older(Flulaval,Afluria,Fluarix,Fluzone) (Completed)     Encounter for surveillance of transdermal patch hormonal contraceptive device       Relevant Medications   norelgestromin -ethinyl estradiol  (XULANE) 150-35 MCG/24HR  transdermal patch     Mass of upper inner quadrant of left breast       Relevant Orders   US  BREAST COMPLETE UNI LEFT INC AXILLA      Return in about 1 week (around 03/29/2024) for depression and anxiety.     Roselie Mood, NP

## 2024-03-29 ENCOUNTER — Encounter: Payer: Self-pay | Admitting: Nurse Practitioner

## 2024-03-29 ENCOUNTER — Ambulatory Visit: Admitting: Nurse Practitioner

## 2024-03-29 VITALS — BP 124/76 | HR 84 | Temp 98.2°F | Ht 60.0 in | Wt 206.4 lb

## 2024-03-29 DIAGNOSIS — Z3041 Encounter for surveillance of contraceptive pills: Secondary | ICD-10-CM | POA: Diagnosis not present

## 2024-03-29 DIAGNOSIS — F411 Generalized anxiety disorder: Secondary | ICD-10-CM | POA: Diagnosis not present

## 2024-03-29 MED ORDER — NORETHINDRONE ACET-ETHINYL EST 1-20 MG-MCG PO TABS: 1.0000 | ORAL_TABLET | Freq: Every day | ORAL | 11 refills | Status: AC

## 2024-03-29 NOTE — Assessment & Plan Note (Signed)
 Reports mood swing and irritability with xulane patch. Mood improves when patch is removed for 1week. Previous use of junel Fe x 71month. This was discontinued fur to GI side effects-nausea and ABDOMEN cramps. We discussed switching patch to depo provera injection or referral to GYN to discuss use of an implant.  She declined depo provera due to potential weight gain. She declined referral to GYN stating she want to get pregnancy in 1year. She opted to resume COC. Sent lo estrin. Advised to take pill in PM with food. F/up in 3months

## 2024-03-29 NOTE — Assessment & Plan Note (Signed)
 Reports increased irritability and mood swing with xulane patch. Symptoms resolve once she removes path for 1week.

## 2024-03-29 NOTE — Patient Instructions (Addendum)
 [email or web address] Stop xulane patch Start lo estrin pill, take in PM with food

## 2024-03-29 NOTE — Progress Notes (Signed)
 Established Patient Visit  Patient: Tara Joseph   DOB: 1990-07-04   33 y.o. Female  MRN: [id number] Visit Date: 03/29/2024  Subjective:    Chief Complaint  Patient presents with   Follow-up    1 week follow up for depression and anxiety Noticed the irritability started around the time birth control patch was started    HPI Accompanied by husband.  GAD (generalized anxiety disorder) Reports increased irritability and mood swing with xulane patch. Symptoms resolve once she removes path for 1week.  Contraception management Reports mood swing and irritability with xulane patch. Mood improves when patch is removed for 1week. Previous use of junel Fe x 43month. This was discontinued fur to GI side effects-nausea and ABDOMEN cramps. We discussed switching patch to depo provera injection or referral to GYN to discuss use of an implant.  She declined depo provera due to potential weight gain. She declined referral to GYN stating she want to get pregnancy in 1year. She opted to resume COC. Sent lo estrin. Advised to take pill in PM with food. F/up in 3months  Reviewed medical, surgical, and social history today  Medications: Outpatient Medications Prior to Visit  Medication Sig   albuterol  (VENTOLIN  HFA) 108 (90 Base) MCG/ACT inhaler Inhale 1-2 puffs into the lungs every 6 (six) hours as needed for wheezing or shortness of breath. No additional refill without appointment with pcp   Ferrous Sulfate (IRON PO) Take by mouth daily.   levothyroxine  (SYNTHROID ) 25 MCG tablet Take 1 tablet (25 mcg total) by mouth daily before breakfast.   omeprazole  (PRILOSEC) 40 MG capsule TAKE 1 CAPSULE (40 MG TOTAL) BY MOUTH DAILY.   Probiotic Product (CULTURELLE PROBIOTICS) CHEW Take one a day for 2 weeks   [DISCONTINUED] norelgestromin -ethinyl estradiol  (XULANE) 150-35 MCG/24HR transdermal patch Place 1 patch onto the skin once a week.   No facility-administered medications prior to  visit.   Reviewed past medical and social history.   ROS per HPI above      Objective:  BP 124/76 (BP Location: Right Arm, Patient Position: Sitting, Cuff Size: Large)   Pulse 84   Temp 98.2 F (36.8 C) (Oral)   Ht 5' (1.524 m)   Wt 206 lb 6.4 oz (93.6 kg)   LMP 03/20/2024   SpO2 94%   BMI 40.31 kg/m      Physical Exam Vitals and nursing note reviewed.  Cardiovascular:     Rate and Rhythm: Normal rate.     Pulses: Normal pulses.  Pulmonary:     Effort: Pulmonary effort is normal.  Neurological:     Mental Status: She is alert and oriented to person, place, and time.  Psychiatric:        Mood and Affect: Mood normal.        Behavior: Behavior normal.        Thought Content: Thought content normal.     No results found for any visits on 03/29/24.    Assessment & Plan:    Problem List Items Addressed This Visit     Contraception management   Reports mood swing and irritability with xulane patch. Mood improves when patch is removed for 1week. Previous use of junel Fe x 43month. This was discontinued fur to GI side effects-nausea and ABDOMEN cramps. We discussed switching patch to depo provera injection or referral to GYN to discuss use of an implant.  She declined depo provera due to  potential weight gain. She declined referral to GYN stating she want to get pregnancy in 1year. She opted to resume COC. Sent lo estrin. Advised to take pill in PM with food. F/up in 3months      Relevant Medications   norethindrone -ethinyl estradiol  (LOESTRIN 1/20, 21,) 1-20 MG-MCG tablet   GAD (generalized anxiety disorder) - Primary   Reports increased irritability and mood swing with xulane patch. Symptoms resolve once she removes path for 1week.      Return in about 3 months (around 06/29/2024) for depression and anxiety, contraception management.     Roselie Mood, NP

## 2024-04-06 ENCOUNTER — Inpatient Hospital Stay: Admission: RE | Admit: 2024-04-06 | Discharge: 2024-04-06 | Attending: Nurse Practitioner

## 2024-04-06 DIAGNOSIS — R928 Other abnormal and inconclusive findings on diagnostic imaging of breast: Secondary | ICD-10-CM | POA: Diagnosis not present

## 2024-04-06 DIAGNOSIS — N6322 Unspecified lump in the left breast, upper inner quadrant: Secondary | ICD-10-CM

## 2024-04-06 DIAGNOSIS — N6489 Other specified disorders of breast: Secondary | ICD-10-CM | POA: Diagnosis not present

## 2024-04-17 ENCOUNTER — Telehealth: Payer: Self-pay

## 2024-04-17 NOTE — Telephone Encounter (Signed)
 Pt requesting copies of her [employer] vaccination record and result of latest TB test.  Thank you

## 2024-04-18 NOTE — Telephone Encounter (Signed)
 Printed immunization records for her.  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    She will bring the letter with what she needs so we can schedule her correctly. Dm/cma

## 2024-04-18 NOTE — Telephone Encounter (Signed)
 Pt brought in paperwork & I scheduled a tb skin test.

## 2024-04-18 NOTE — Telephone Encounter (Signed)
 Pt would like a call when this paperwork is complete. She needs to pick it up.  [phone]

## 2024-04-19 ENCOUNTER — Encounter

## 2024-04-19 ENCOUNTER — Ambulatory Visit: Admitting: *Deleted

## 2024-04-19 DIAGNOSIS — Z111 Encounter for screening for respiratory tuberculosis: Secondary | ICD-10-CM

## 2024-04-19 NOTE — Telephone Encounter (Unsigned)
 Copied from CRM #[id number]. Topic: Clinical - Lab/Test Results >> Apr 18, 2024 11:54 AM Charolett L wrote: Reason for CRM: Patient called in to verify if documents were ready for pickup and was adv that they need to be signed. Patient stated that any provider can sign them she just needs them for school

## 2024-04-19 NOTE — Progress Notes (Deleted)
 PPD Placement note Tara Joseph, 33 y.o. female is here today for placement of PPD test Reason for PPD test: *** Pt taken PPD test before: {yes no:314489} Verified in allergy area and with patient that they are not allergic to the products PPD is made of (Phenol or Tween). {yes wn:[id number]} Is patient taking any oral or IV steroid medication now or have they taken it in the last month? {yes wn:[id number]} Has the patient ever received the BCG vaccine?: {yes no:[id number]} Has the patient been in recent contact with anyone known or suspected of having active TB disease?: {yes no:[id number]}      Date of exposure (if applicable): ***      Name of person they were exposed to (if applicable): *** Patient's Country of origin?: *** O: Alert and oriented in NAD. P:  PPD placed on 04/19/2024.  Patient advised to return for reading within 48-72 hours.

## 2024-04-21 ENCOUNTER — Ambulatory Visit

## 2024-04-21 NOTE — Progress Notes (Signed)
 Patient is in office today for a nurse visit for PPD reading. PPD was given at another office. Reading was done 04/22/19 result was negative and forms completed and given back to pt. Copy will be put in file.

## 2024-04-21 NOTE — Progress Notes (Signed)
 This encounter was created in error - please disregard.

## 2024-04-24 ENCOUNTER — Ambulatory Visit
Admission: EM | Admit: 2024-04-24 | Discharge: 2024-04-24 | Disposition: A | Discharge status: Home / Self Care | Attending: Nurse Practitioner | Admitting: Nurse Practitioner

## 2024-04-24 ENCOUNTER — Ambulatory Visit (INDEPENDENT_AMBULATORY_CARE_PROVIDER_SITE_OTHER)

## 2024-04-24 DIAGNOSIS — M25562 Pain in left knee: Secondary | ICD-10-CM

## 2024-04-24 MED ORDER — IBUPROFEN 800 MG PO TABS: 800.0000 mg | ORAL_TABLET | Freq: Three times a day (TID) | ORAL | 0 refills | Status: AC | PRN

## 2024-04-24 NOTE — ED Triage Notes (Signed)
 Pt reports left knee pain since this morning when she woke up. Pt reports she is doing weight training, last time was 2 days ago. Pain is worse when knee is bend. Ibuprofen  gives no relief.

## 2024-04-24 NOTE — ED Provider Notes (Signed)
 " [facility]    CSN: [id number] Arrival date & time: 04/24/24  1233      History   Chief Complaint Chief Complaint  Patient presents with   Knee Pain          HPI Tara Joseph is a 33 y.o. female.   Discussed the use of AI scribe software for clinical note transcription with the patient, who gave verbal consent to proceed.   The patient presents with acute onset left knee pain that began this morning upon awakening. She reports going to bed the previous night without pain, but upon waking experienced significant discomfort with movement. The pain involves the entire knee with motion, including straight leg raise and knee extension, and is most pronounced along the medial aspect, with some lateral discomfort noted earlier in the day. The pain was initially severe enough to markedly limit function and made walking very difficult.  She reports performing weight training a few days prior, including squatting and knee-bending exercises, which are part of her usual routine and not a new activity. She denies any specific injury, trauma, or fall. There is no associated numbness or tingling of the leg, foot, or toes. She took 600 mg of ibuprofen , which may have provided partial improvement in pain and mobility. The patient notes a history of mild, intermittent knee discomfort with certain movements, but states that this episode is significantly more severe than prior symptoms.  The following sections of the patient's history were reviewed and updated as appropriate: allergies, current medications, past family history, past medical history, past social history, past surgical history, and problem list.      Past Medical History:  Diagnosis Date   Arthritis    Asthma    Migraines    Prediabetes    UTI (urinary tract infection)     Patient Active Problem List   Diagnosis Date Noted   Hypothyroidism 01/17/2024   Contraception management 01/17/2024   Obesity (BMI 30-39.9)  01/17/2024   Fatigue 01/17/2024   Menorrhagia with regular cycle 08/28/2021   Gastroesophageal reflux disease 07/25/2021   GAD (generalized anxiety disorder) 07/25/2021   Elevated blood pressure reading 07/25/2021   Class 2 severe obesity due to excess calories with serious comorbidity and body mass index (BMI) of 35.0 to 35.9 in adult 02/16/2020   Acanthosis nigricans 02/16/2020   Asthma 01/11/2020    History reviewed. No pertinent surgical history.  OB History   No obstetric history on file.      Home Medications    Prior to Admission medications  Medication Sig Start Date End Date Taking? Authorizing Provider  albuterol  (VENTOLIN  HFA) 108 (90 Base) MCG/ACT inhaler Inhale 1-2 puffs into the lungs every 6 (six) hours as needed for wheezing or shortness of breath. No additional refill without appointment with pcp 06/18/23   Nche, Roselie Rockford, NP  Ferrous Sulfate (IRON PO) Take by mouth daily.    [provider]  levothyroxine  (SYNTHROID ) 25 MCG tablet Take 1 tablet (25 mcg total) by mouth daily before breakfast. 03/22/24   Nche, Roselie Rockford, NP  norethindrone -ethinyl estradiol  (LOESTRIN 1/20, 21,) 1-20 MG-MCG tablet Take 1 tablet by mouth daily. 03/29/24   Nche, Roselie Rockford, NP  omeprazole  (PRILOSEC) 40 MG capsule TAKE 1 CAPSULE (40 MG TOTAL) BY MOUTH DAILY. 02/15/24   McElwee, Tinnie LABOR, NP  Probiotic Product (CULTURELLE PROBIOTICS) CHEW Take one a day for 2 weeks 05/14/21   Flint Sonny POUR, PA-C    Family History Family History  Problem Relation Age of Onset   Hypertension Mother    Thyroid  disease Mother    Diabetes Father    Hypertension Brother    Arthritis Maternal Grandmother    Hypertension Brother     Social History Social History[1]   Allergies   Prednisone    Review of Systems Review of Systems  Musculoskeletal:  Positive for arthralgias and gait problem (hurts to walk).  Neurological:  Negative for weakness and numbness.  All other systems  reviewed and are negative.    Physical Exam Triage Vital Signs ED Triage Vitals  Encounter Vitals Group     BP 04/24/24 1402 (!) 130/90     Girls Systolic BP Percentile --      Girls Diastolic BP Percentile --      Boys Systolic BP Percentile --      Boys Diastolic BP Percentile --      Pulse Rate 04/24/24 1402 82     Resp 04/24/24 1402 18     Temp 04/24/24 1402 98.9 F (37.2 C)     Temp Source 04/24/24 1402 Oral     SpO2 04/24/24 1402 95 %     Weight --      Height --      Head Circumference --      Peak Flow --      Pain Score 04/24/24 1400 10     Pain Loc --      Pain Education --      Exclude from Growth Chart --    No data found.  Updated Vital Signs BP (!) 130/90 (BP Location: Right Arm)   Pulse 82   Temp 98.9 F (37.2 C) (Oral)   Resp 18   SpO2 95%   Visual Acuity Right Eye Distance:   Left Eye Distance:   Bilateral Distance:    Right Eye Near:   Left Eye Near:    Bilateral Near:     Physical Exam Vitals reviewed.  Constitutional:      General: She is awake. She is not in acute distress.    Appearance: Normal appearance. She is well-developed. She is not ill-appearing, toxic-appearing or diaphoretic.  HENT:     Head: Normocephalic.     Right Ear: Hearing normal.     Left Ear: Hearing normal.     Nose: Nose normal.     Mouth/Throat:     Mouth: Mucous membranes are moist.  Eyes:     General: Vision grossly intact.     Conjunctiva/sclera: Conjunctivae normal.  Cardiovascular:     Rate and Rhythm: Normal rate and regular rhythm.     Heart sounds: Normal heart sounds.  Pulmonary:     Effort: Pulmonary effort is normal.     Breath sounds: Normal breath sounds and air entry.  Musculoskeletal:        General: Normal range of motion.     Cervical back: Normal range of motion and neck supple.     Left knee: No swelling, deformity, effusion, erythema, lacerations, bony tenderness or crepitus. Normal range of motion. Tenderness present over the medial  joint line. Normal alignment.       Legs:     Comments: Pain noted to the medial aspect of the anterior knee.  No swelling, deformity, erythema or obvious effusion.  Full range of motion noted but with pain.  Full strength, sensation of the left lower extremity.  Skin:    General: Skin is warm and dry.  Neurological:  General: No focal deficit present.     Mental Status: She is alert and oriented to person, place, and time.     Sensory: Sensation is intact.     Motor: Motor function is intact.     Gait: Gait is intact.  Psychiatric:        Speech: Speech normal.        Behavior: Behavior is cooperative.      UC Treatments / Results  Labs (all labs ordered are listed, but only abnormal results are displayed) Labs Reviewed - No data to display  EKG   Radiology No results found.  Procedures Procedures (including critical care time)  Medications Ordered in UC Medications - No data to display  Initial Impression / Assessment and Plan / UC Course  I have reviewed the triage vital signs and the nursing notes.  Pertinent labs & imaging results that were available during my care of the patient were reviewed by me and considered in my medical decision making (see chart for details).     The patient presents with acute onset left knee pain that began upon awakening, without preceding trauma or injury. Pain is present with movement and most pronounced along the medial aspect of the knee, with initial functional limitation and difficulty walking. She reports recent weight training involving squatting, which is part of her usual routine, and denies numbness or tingling. Examination reveals medial anterior knee tenderness with full range of motion, strength, and sensation, without swelling, erythema, deformity, or effusion. An ice pack was provided for symptom relief. Knee X-ray obtained today shows no acute abnormality on preliminary review, with official radiology interpretation  pending. The presentation is most consistent with an exercise-related knee strain. Management includes placement of a knee brace for support, rest, ice, compression, and elevation, and prescription of ibuprofen  for pain and inflammation with instructions to avoid additional NSAIDs. The patient will be notified if imaging results are abnormal and may otherwise review them in MyChart. She was advised to follow up with her primary care provider or orthopedics if symptoms do not improve or worsen, and to seek emergency care for increasing pain, inability to bear weight, new swelling, redness, fever, numbness, or other concerning symptoms.  Today's evaluation has revealed no signs of a dangerous process. Discussed diagnosis with patient and/or guardian. Patient and/or guardian aware of their diagnosis, possible red flag symptoms to watch out for and need for close follow up. Patient and/or guardian understands verbal and written discharge instructions. Patient and/or guardian comfortable with plan and disposition.  Patient and/or guardian has a clear mental status at this time, good insight into illness (after discussion and teaching) and has clear judgment to make decisions regarding their care  Documentation was completed with the aid of voice recognition software. Transcription may contain typographical errors.  Final Clinical Impressions(s) / UC Diagnoses   Final diagnoses:  Acute pain of left knee     Discharge Instructions      You were seen today for sudden left knee pain that started this morning. Your exam do not show any signs of a fracture or serious injury, which is reassuring. The X-ray does not show any obvious abnormalities on initial review; however, the official radiology interpretation is still pending. You will be notified if the final report identifies any abnormal findings, and you may also review the results in MyChart once available.  Your symptoms are most consistent with a knee  strain, likely related to recent exercise. Rest the knee as  much as possible over the next several days and avoid activities that cause pain, including squatting or heavy lifting. Use the knee brace that was provided to help support the joint. Apply ice to the knee for 15-20 minutes at a time several times a day, and keep the leg elevated when resting to help reduce pain and inflammation. Take the prescribed Motrin  as directed for pain and swelling, and do not take other anti-inflammatory medications such as ibuprofen , Advil , Aleve , or naproxen  while using this medication. If needed, you may take Tylenol  (acetaminophen ) 1000 mg every six hours for additional pain relief. This equals two 500 mg tablets at a time. Be careful not to take more than 4000 mg of Tylenol  in a 24-hour period.  Follow up with an orthopedic specialist if your pain does not improve over the next several days, if symptoms worsen, or if you have difficulty returning to normal activity. Seek emergency care right away if you develop severe or worsening pain, inability to bear weight, significant swelling, redness, warmth, fever, numbness or tingling in the leg or foot, or any other concerning symptoms.      ED Prescriptions   None    PDMP not reviewed this encounter.     [1]  Social History Tobacco Use   Smoking status: Never   Smokeless tobacco: Never  Vaping Use   Vaping status: Never Used  Substance Use Topics   Alcohol use: Not Currently   Drug use: Never     Iola Lukes, FNP 04/24/24 1503  "

## 2024-04-24 NOTE — Discharge Instructions (Addendum)
 You were seen today for sudden left knee pain that started this morning. Your exam do not show any signs of a fracture or serious injury, which is reassuring. The X-ray does not show any obvious abnormalities on initial review; however, the official radiology interpretation is still pending. You will be notified if the final report identifies any abnormal findings, and you may also review the results in MyChart once available.  Your symptoms are most consistent with a knee strain, likely related to recent exercise. Rest the knee as much as possible over the next several days and avoid activities that cause pain, including squatting or heavy lifting. Use the knee brace that was provided to help support the joint. Apply ice to the knee for 15-20 minutes at a time several times a day, and keep the leg elevated when resting to help reduce pain and inflammation. Take the prescribed Motrin  as directed for pain and swelling, and do not take other anti-inflammatory medications such as ibuprofen , Advil , Aleve , or naproxen  while using this medication. If needed, you may take Tylenol  (acetaminophen ) 1000 mg every six hours for additional pain relief. This equals two 500 mg tablets at a time. Be careful not to take more than 4000 mg of Tylenol  in a 24-hour period.  Follow up with an orthopedic specialist if your pain does not improve over the next several days, if symptoms worsen, or if you have difficulty returning to normal activity. Seek emergency care right away if you develop severe or worsening pain, inability to bear weight, significant swelling, redness, warmth, fever, numbness or tingling in the leg or foot, or any other concerning symptoms.

## 2024-04-25 ENCOUNTER — Ambulatory Visit (HOSPITAL_COMMUNITY): Payer: Self-pay

## 2024-06-29 ENCOUNTER — Ambulatory Visit: Admitting: Nurse Practitioner
